# Patient Record
Sex: Male | Born: 1945
Health system: Southern US, Community
[De-identification: ages and names within clinical notes are randomized; demographics above are authoritative.]

## PROBLEM LIST (undated history)

## (undated) DIAGNOSIS — I639 Cerebral infarction, unspecified: Secondary | ICD-10-CM

## (undated) HISTORY — DX: Cerebral infarction, unspecified: I63.9

---

## 1998-05-17 ENCOUNTER — Emergency Department (HOSPITAL_COMMUNITY): Admission: EM | Admit: 1998-05-17 | Discharge: 1998-05-17 | Payer: Self-pay | Admitting: Emergency Medicine

## 2000-01-03 ENCOUNTER — Encounter: Payer: Self-pay | Admitting: Emergency Medicine

## 2000-01-03 ENCOUNTER — Emergency Department (HOSPITAL_COMMUNITY): Admission: EM | Admit: 2000-01-03 | Discharge: 2000-01-03 | Payer: Self-pay | Admitting: Emergency Medicine

## 2000-03-07 ENCOUNTER — Emergency Department (HOSPITAL_COMMUNITY): Admission: EM | Admit: 2000-03-07 | Discharge: 2000-03-07 | Payer: Self-pay | Admitting: Emergency Medicine

## 2000-03-07 ENCOUNTER — Encounter: Payer: Self-pay | Admitting: Emergency Medicine

## 2000-03-15 ENCOUNTER — Encounter: Admission: RE | Admit: 2000-03-15 | Discharge: 2000-03-15 | Payer: Self-pay | Admitting: Hematology and Oncology

## 2000-05-02 ENCOUNTER — Encounter: Admission: RE | Admit: 2000-05-02 | Discharge: 2000-05-02 | Payer: Self-pay | Admitting: Internal Medicine

## 2000-05-07 ENCOUNTER — Encounter: Admission: RE | Admit: 2000-05-07 | Discharge: 2000-05-07 | Payer: Self-pay | Admitting: Hematology and Oncology

## 2000-05-07 ENCOUNTER — Ambulatory Visit (HOSPITAL_COMMUNITY): Admission: RE | Admit: 2000-05-07 | Discharge: 2000-05-07 | Payer: Self-pay | Admitting: *Deleted

## 2000-05-07 ENCOUNTER — Encounter: Payer: Self-pay | Admitting: *Deleted

## 2000-05-14 ENCOUNTER — Encounter: Admission: RE | Admit: 2000-05-14 | Discharge: 2000-05-14 | Payer: Self-pay | Admitting: Hematology and Oncology

## 2000-05-21 ENCOUNTER — Encounter: Admission: RE | Admit: 2000-05-21 | Discharge: 2000-05-21 | Payer: Self-pay | Admitting: Internal Medicine

## 2000-08-28 ENCOUNTER — Encounter: Payer: Self-pay | Admitting: Neurosurgery

## 2000-08-29 ENCOUNTER — Encounter: Payer: Self-pay | Admitting: Neurosurgery

## 2000-08-29 ENCOUNTER — Inpatient Hospital Stay (HOSPITAL_COMMUNITY): Admission: RE | Admit: 2000-08-29 | Discharge: 2000-08-30 | Payer: Self-pay | Admitting: Neurosurgery

## 2000-10-11 ENCOUNTER — Ambulatory Visit (HOSPITAL_COMMUNITY): Admission: RE | Admit: 2000-10-11 | Discharge: 2000-10-11 | Payer: Self-pay | Admitting: Neurosurgery

## 2000-10-11 ENCOUNTER — Encounter: Admission: RE | Admit: 2000-10-11 | Discharge: 2000-10-11 | Payer: Self-pay | Admitting: Internal Medicine

## 2000-10-11 ENCOUNTER — Encounter: Payer: Self-pay | Admitting: Neurosurgery

## 2002-09-14 ENCOUNTER — Encounter: Admission: RE | Admit: 2002-09-14 | Discharge: 2002-09-14 | Payer: Self-pay | Admitting: Internal Medicine

## 2002-10-16 ENCOUNTER — Ambulatory Visit (HOSPITAL_COMMUNITY): Admission: RE | Admit: 2002-10-16 | Discharge: 2002-10-16 | Payer: Self-pay | Admitting: Neurosurgery

## 2002-10-20 ENCOUNTER — Ambulatory Visit (HOSPITAL_COMMUNITY): Admission: RE | Admit: 2002-10-20 | Discharge: 2002-10-20 | Payer: Self-pay | Admitting: Neurosurgery

## 2002-10-20 ENCOUNTER — Encounter: Payer: Self-pay | Admitting: Neurosurgery

## 2002-11-09 ENCOUNTER — Encounter: Admission: RE | Admit: 2002-11-09 | Discharge: 2002-12-03 | Payer: Self-pay | Admitting: Neurosurgery

## 2003-02-19 ENCOUNTER — Emergency Department (HOSPITAL_COMMUNITY): Admission: EM | Admit: 2003-02-19 | Discharge: 2003-02-19 | Payer: Self-pay | Admitting: Emergency Medicine

## 2003-12-25 DIAGNOSIS — I639 Cerebral infarction, unspecified: Secondary | ICD-10-CM

## 2003-12-25 HISTORY — DX: Cerebral infarction, unspecified: I63.9

## 2003-12-25 HISTORY — PX: NECK SURGERY: SHX720

## 2003-12-29 ENCOUNTER — Emergency Department (HOSPITAL_COMMUNITY): Admission: EM | Admit: 2003-12-29 | Discharge: 2003-12-29 | Payer: Self-pay | Admitting: Emergency Medicine

## 2004-01-03 ENCOUNTER — Emergency Department (HOSPITAL_COMMUNITY): Admission: AD | Admit: 2004-01-03 | Discharge: 2004-01-03 | Payer: Self-pay | Admitting: Family Medicine

## 2004-05-19 ENCOUNTER — Encounter: Admission: RE | Admit: 2004-05-19 | Discharge: 2004-05-19 | Payer: Self-pay | Admitting: Internal Medicine

## 2004-05-30 ENCOUNTER — Ambulatory Visit (HOSPITAL_COMMUNITY): Admission: RE | Admit: 2004-05-30 | Discharge: 2004-05-30 | Payer: Self-pay | Admitting: Internal Medicine

## 2004-06-19 ENCOUNTER — Encounter: Admission: RE | Admit: 2004-06-19 | Discharge: 2004-06-19 | Payer: Self-pay | Admitting: Internal Medicine

## 2004-06-27 ENCOUNTER — Encounter: Admission: RE | Admit: 2004-06-27 | Discharge: 2004-06-27 | Payer: Self-pay | Admitting: Internal Medicine

## 2004-08-15 ENCOUNTER — Encounter: Admission: RE | Admit: 2004-08-15 | Discharge: 2004-08-15 | Payer: Self-pay | Admitting: Internal Medicine

## 2004-08-22 ENCOUNTER — Encounter: Admission: RE | Admit: 2004-08-22 | Discharge: 2004-09-01 | Payer: Self-pay | Admitting: Internal Medicine

## 2007-02-22 ENCOUNTER — Emergency Department (HOSPITAL_COMMUNITY): Admission: EM | Admit: 2007-02-22 | Discharge: 2007-02-22 | Payer: Self-pay | Admitting: Emergency Medicine

## 2007-03-06 ENCOUNTER — Ambulatory Visit: Payer: Self-pay | Admitting: Family Medicine

## 2007-03-07 ENCOUNTER — Ambulatory Visit: Payer: Self-pay | Admitting: *Deleted

## 2007-08-06 ENCOUNTER — Ambulatory Visit (HOSPITAL_COMMUNITY): Admission: RE | Admit: 2007-08-06 | Discharge: 2007-08-06 | Payer: Self-pay | Admitting: Urology

## 2007-10-09 ENCOUNTER — Encounter: Payer: Self-pay | Admitting: Urology

## 2007-10-09 ENCOUNTER — Ambulatory Visit (HOSPITAL_COMMUNITY): Admission: RE | Admit: 2007-10-09 | Discharge: 2007-10-11 | Payer: Self-pay | Admitting: Urology

## 2007-11-18 ENCOUNTER — Ambulatory Visit: Payer: Self-pay | Admitting: Family Medicine

## 2007-11-18 LAB — CONVERTED CEMR LAB: PSA: 0.01 ng/mL — ABNORMAL LOW (ref 0.10–4.00)

## 2007-12-25 HISTORY — PX: PROSTATE SURGERY: SHX751

## 2008-02-11 ENCOUNTER — Ambulatory Visit: Payer: Self-pay | Admitting: Family Medicine

## 2008-02-11 LAB — CONVERTED CEMR LAB
ALT: 10 units/L (ref 0–53)
AST: 12 units/L (ref 0–37)
Albumin: 4 g/dL (ref 3.5–5.2)
Basophils Absolute: 0.1 10*3/uL (ref 0.0–0.1)
Basophils Relative: 1 % (ref 0–1)
Calcium: 9.6 mg/dL (ref 8.4–10.5)
Eosinophils Absolute: 0.4 10*3/uL (ref 0.0–0.7)
Eosinophils Relative: 4 % (ref 0–5)
Glucose, Bld: 93 mg/dL (ref 70–99)
HCT: 42.1 % (ref 39.0–52.0)
MCV: 94.8 fL (ref 78.0–100.0)
Monocytes Absolute: 0.9 10*3/uL (ref 0.1–1.0)
Monocytes Relative: 9 % (ref 3–12)
PSA: 0.01 ng/mL — ABNORMAL LOW (ref 0.10–4.00)
Platelets: 276 10*3/uL (ref 150–400)
Potassium: 4.4 meq/L (ref 3.5–5.3)
RDW: 16 % — ABNORMAL HIGH (ref 11.5–15.5)
Sodium: 140 meq/L (ref 135–145)
Total Protein: 7.1 g/dL (ref 6.0–8.3)
WBC: 10.1 10*3/uL (ref 4.0–10.5)

## 2008-03-05 IMAGING — CR DG CHEST 2V
2 series · 2 of 2 positions shown · non-contrast
Comparison: None.

CLINICAL DATA: Pre-op prostate cancer.  
 CHEST ? 2 VIEW:

[w chest pa]
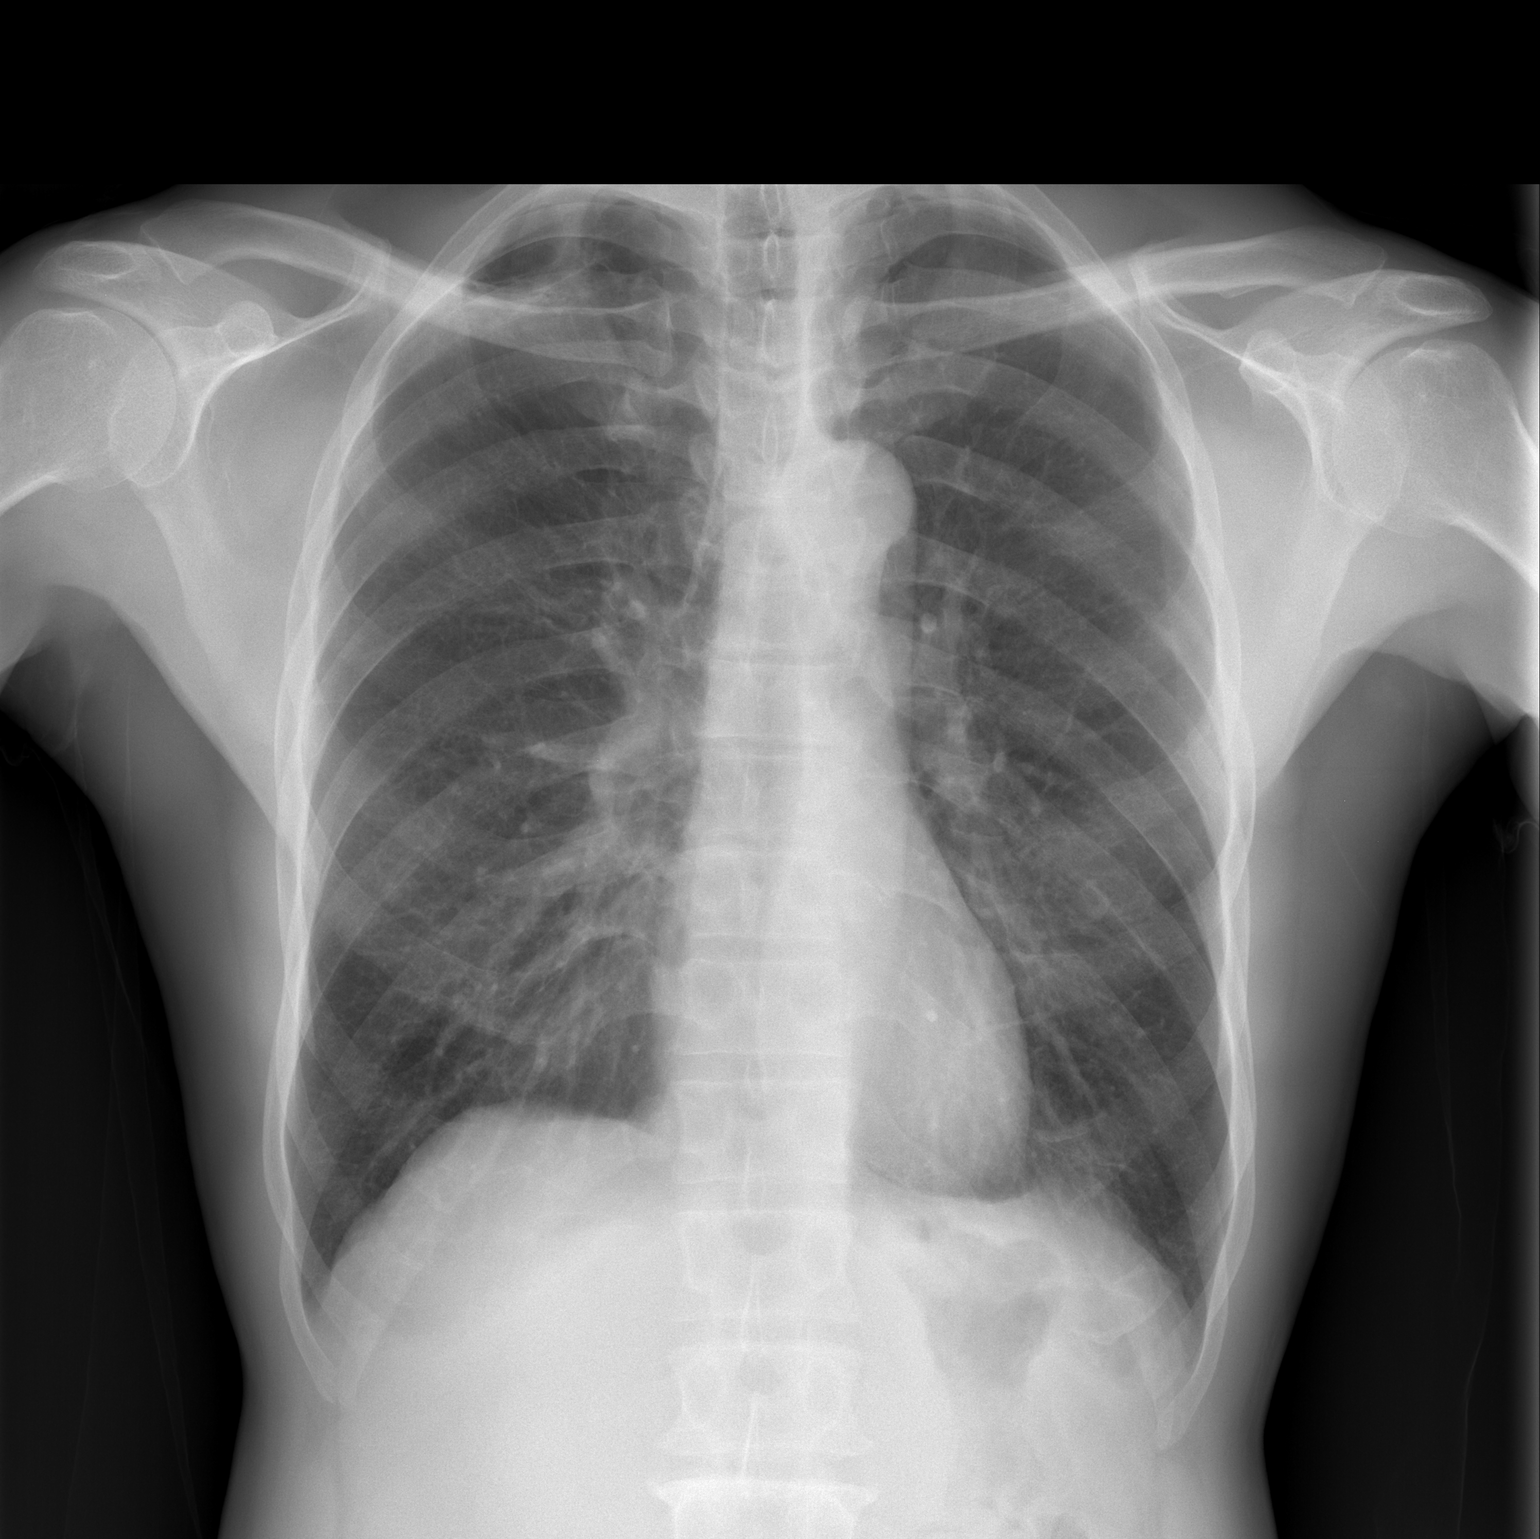

[w chest lat]
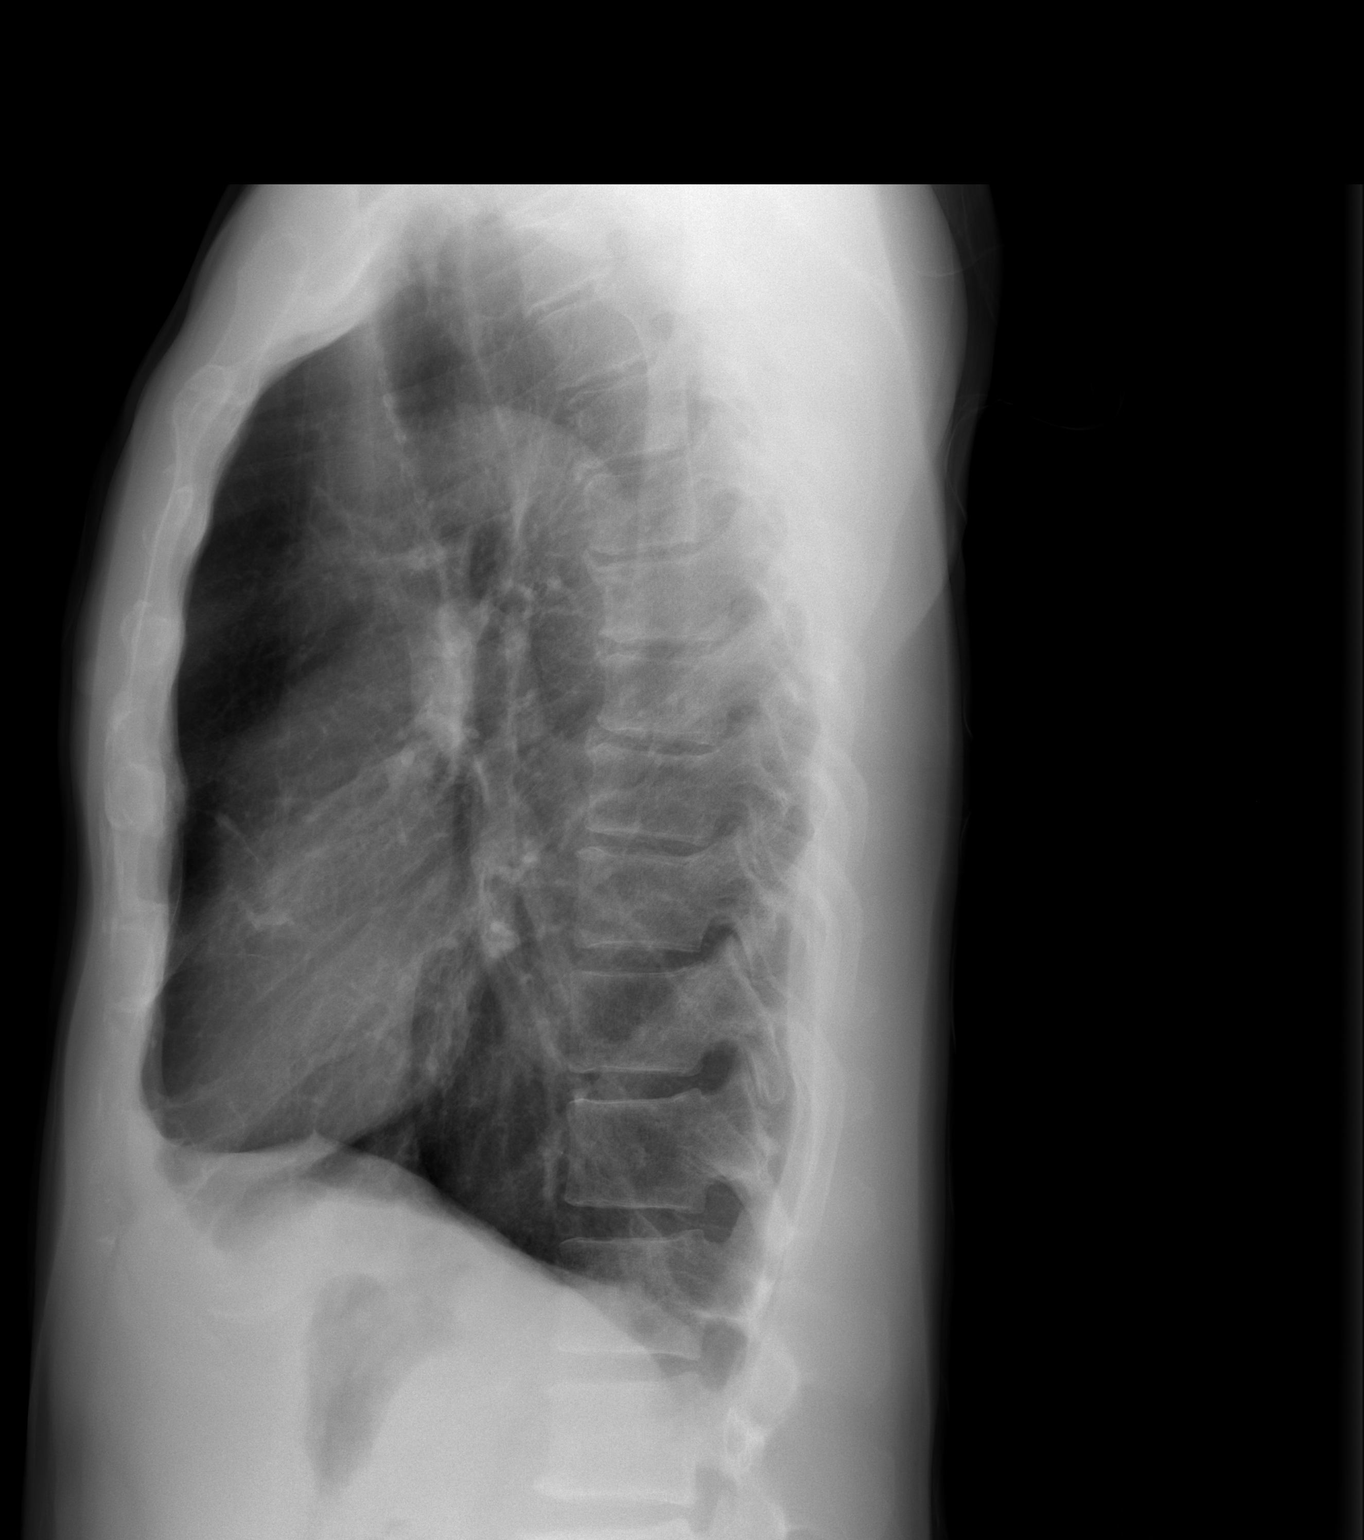

[2 of 2 positions shown; findings below may reference images not displayed]

FINDINGS: Trachea is midline.  Heart size normal.  Lungs are clear.  No pleural fluid.
IMPRESSION: No acute findings.

## 2009-08-05 ENCOUNTER — Ambulatory Visit: Payer: Self-pay | Admitting: Internal Medicine

## 2009-08-05 ENCOUNTER — Encounter: Payer: Self-pay | Admitting: Family Medicine

## 2009-08-05 LAB — CONVERTED CEMR LAB
Basophils Absolute: 0 10*3/uL (ref 0.0–0.1)
Basophils Relative: 0 % (ref 0–1)
CO2: 25 meq/L (ref 19–32)
Creatinine, Ser: 1.14 mg/dL (ref 0.40–1.50)
Eosinophils Absolute: 0.1 10*3/uL (ref 0.0–0.7)
HCT: 45.1 % (ref 39.0–52.0)
Monocytes Absolute: 1.8 10*3/uL — ABNORMAL HIGH (ref 0.1–1.0)
RBC: 4.68 M/uL (ref 4.22–5.81)
Total Bilirubin: 0.8 mg/dL (ref 0.3–1.2)
Total Protein: 8.5 g/dL — ABNORMAL HIGH (ref 6.0–8.3)

## 2009-08-06 ENCOUNTER — Emergency Department (HOSPITAL_COMMUNITY): Admission: EM | Admit: 2009-08-06 | Discharge: 2009-08-06 | Payer: Self-pay | Admitting: Emergency Medicine

## 2011-01-14 ENCOUNTER — Encounter: Payer: Self-pay | Admitting: Internal Medicine

## 2011-05-08 NOTE — H&P (Signed)
NAMEJIHAN, Charles Russell              ACCOUNT NO.:  [id number]   MEDICAL RECORD NO.:  [id number]          PATIENT TYPE:  OIB   LOCATION:  1433                         FACILITY:  [facility]   PHYSICIAN:  Excell Seltzer. Annabell Howells, M.D.    DATE OF BIRTH:  March 15, 1946   DATE OF ADMISSION:  10/09/2007  DATE OF DISCHARGE:                              HISTORY & PHYSICAL   CHIEF COMPLAINT:  Here for surgery.   HISTORY OF PRESENT ILLNESS:  Charles Russell is a 65 year old male who was  initially evaluated for urinary retention.  He is treated for BPH with  Proscar and Flomax.  His retention somewhat improved.  he had a PSA at  the time of his retention episode which is elevated at 16.  Repeat was  8.32.  This prompted transrectal ultrasound-guided biopsy which revealed  Gleason 3 + 3 = 6 in 2 of 6 cores on the right, 5% of cores.  The left  was free of any adenocarcinoma.  He was counseled on active  surveillance, watchful waiting, surgical management, and x-ray therapy.  Because of the patient's urinary history, radiation therapy was not felt  to be an adequate option.  He therefore elected for surgical management.  He presents for an attempt at laparoscopic-assisted radical  prostatectomy.   PAST MEDICAL HISTORY:  1. Heart murmur.  2. Small cerebrovascular accident.   SURGICAL HISTORY:  Cervical laminectomy.   FAMILY HISTORY:  Positive for colon cancer.   SOCIAL HISTORY:  Patient smokes less than 1/2 pack a day of cigarettes.  He has occasional ethanol use.  He is unmarried and has 3 children.   REVIEW OF SYSTEMS:  Positive for constipation.  Multisystem review is  performed and is negative for all symptoms except as in the HPI.   PHYSICAL EXAM:  VITAL SIGNS:  Afebrile with stable vitals.  GENERAL:  No acute distress.  HEENT:  Normocephalic and atraumatic.  NECK:  Supple with no lymphadenopathy.  CHEST:  Clear to auscultation.  HEART:  Regular in rate and rhythm with a soft heart murmur.  ABDOMEN:   Soft, nontender, and nondistended with no surgical scars.  GU:  Uncircumcised phallus.  Testes are descended bilaterally, normal in  contour.  Prostate exam reveals mildly enlarged with 2+ BPH.  There is  no nodularity or induration.  He does have hemorrhoids.  EXTREMITIES:  Warm and well perfused.  SKIN:  Without rashes or lesions.   ASSESSMENT:  Low-risk adenocarcinoma of the prostate.   PLAN:  The patient will be admitted to the hospital.  He will undergo an  attempt at robotic-assisted laparoscopic radical prostatectomy.  He has  been informed of the risks and benefits of the procedure and has signed  the consent form found on the chart.  This will be undertaken under the  care of [name].      Terie Purser, MD      Excell Seltzer. Annabell Howells, M.D.  Electronically Signed    JH/MEDQ  D:  10/09/2007  T:  10/10/2007  Job:  [id number]

## 2011-05-08 NOTE — Op Note (Signed)
NAMESANJIT, Charles Russell              ACCOUNT NO.:  [id number]   MEDICAL RECORD NO.:  [id number]          PATIENT TYPE:  OIB   LOCATION:  1433                         FACILITY:  [facility]   PHYSICIAN:  Excell Seltzer. Annabell Howells, M.D.    DATE OF BIRTH:  10/24/1946   DATE OF PROCEDURE:  10/09/2007  DATE OF DISCHARGE:                               OPERATIVE REPORT   PREOPERATIVE DIAGNOSIS:  Adenocarcinoma of the prostate.   POSTOPERATIVE DIAGNOSIS:  Adenocarcinoma of the prostate.   OPERATION/PROCEDURE:  Robotic-assisted laparoscopic prostatectomy.   SURGEON:  [name], M.D.   ASSISTANT:  Melina Schools.   ANESTHESIA:  General endotracheal.   DRAINS:  Jackson-Pratt drain and  20-French Foley catheter.   ESTIMATED BLOOD LOSS:  250 mL.   INDICATIONS FOR PROCEDURE:  Please see full dictated H&P.  Briefly, this  is a 65 year old male with pretreatment clinical T1C, PSA 8.32, Gleason  3 + 3 = 6  adenocarcinoma the prostate.  He has low-volume disease with  2 of 6 cores on the right with 5% involvement. He presents for surgical  management.  He has been informed of the risks and benefits of the  procedure including erectile dysfunction and urinary incontinence.  He  has no erectile dysfunction at baseline.   DESCRIPTION OF PROCEDURE IN DETAIL:  The patient brought to the  operating, identified by wrist band, informed consent was assured and  preoperative time-out was performed.  After the successful induction of  general endotracheal anesthesia, the patient was moved to low lithotomy  position.  The abdomen was shaved.  Rectal tube was placed.  The abdomen  and genitalia were prepped and draped in usual fashion.  The patient was  placed in steep Trendelenburg and  low lithotomy.  We made a 15 mm  incision 17 cm above the pubic symphysis in the midline just to the left  of the umbilicus.  Through this incision we placed our [facility] port.  We  then began insufflation of carbon dioxide.  Low flow  insufflation  revealed a low pressures.  We then changed to standard flow.  Once  pneumoperitoneum was established, we marked for robotic ports, one hand  width from the camera port.  These were placed under direct vision.  We  also placed additional right-sided 5 mm assistant port and 12 mm system  port.  As these were all placed atraumatically, we via proceeded docking  the robot.  Using the 0-degree lens, we then used a monopolar and  bipolar cautery to divide the medial umbilical ligaments bilaterally.  We developed a space of Retzius and dropped the bladder.  The fat was  cleared off the endopelvic fascia exposing the superior aspect of the  dorsal venous complex.  We incised the endopelvic fascia laterally and I  separated the prostate gland from the levator ani bilaterally.  This was  carried toward the apex of prostate which developed a groove between the  dorsal venous complex and the urethra.  Once we had satisfactory grooves  bilaterally, we used the endovascular 55 mm staple to divide the dorsal  venous complex.  We noted excellent hemostasis.  We then placed gentle  traction on the Foley to reveal the prostatovesical angle.  The bladder  neck and the base of the prostate were separated with monopolar cautery.  The anterior surface was divided until we entered the bladder and noted  the Foley catheter.  This was then advanced and placed on anterior  traction thus elevating the prostate.  We did identify the median lobe  that was protuberant.  We adjusted our bladder neck dissection to ensure  occlusion of the median lobe with the specimen.  The adenoma was  separated off the posterior bladder neck.  We administered indigo  carmine and noted excellent efflux from the ureters.  The posterior  plane was developed until we encountered the structures.  The left vas  deferens was grasped and dissected until it was skeletonized and then  divided.  It was then placed on upper traction.   We then similarly  skeletonized and divided the right vas deferens.  We then proceeded with  blunt dissection and occasional cautery to free up the right and left  seminal vesicles, minimizing cautery laterally near the neurovascular  bundles.  Once the structures were dissected, they were all placed on  anterior traction and posterior plane was developed with blunt  dissection.  With this complete, we proceeded to the nerve spare.  The  structures were released and we use sharp dissection without cautery as  well as blunt dissection to drop the nerves bilaterally.  Once this was  complete, we divided the pedicles bilaterally with a Hem-o-lok clips.  Then proceeded to the apex of prostate.  The urethra was divided  sharply.  The remaining attachments were then divided with the bipolar  cautery.  This completed the radical prostatectomy unit and was placed  out of the operative field.  We then irrigated and insufflated with the  rectal tube and noted no rectal injury.  Hemostasis at this point was  excellent.  We then proceeded to anastomosis.  A single stay suture was  used to reapproximate the posterior urethral and posterior bladder neck.  This was done with a 2-0 Vicryl slip-knot.  A running watertight  vesicourethral anastomosis was then performed with a double-armed  running 4-0 Monocryl.  It was tested and we noted a small leak due to  laxity in the knot.  The knot was then placed on traction and cinched in  place with a single Hem-o-lok clip.  We again retested the anastomosis  and it was noted to be watertight.  At this time we placed a JP drain in  the pelvis and brought it out through the fourth arm port.  We then  bagged the prostate specimen.  The 12 mm system port was closed with a  suture passer and 0 Vicryl  tie.  We then extended the fasciotomy in the  12 mm camera port and extracted the prostate specimen.  This was then  closed with a running 0 Vicryl in the usual  fashion.  All skin closed  with surgical staples.  At this time the procedure was terminated.  The  patient tolerated the procedure well with no complications.  All sponge  and needle counts at the conclusion of the case were correct.   Dr. [name] was the attending primary responsible physician and was  present and participating in all aspects of procedure.   DISPOSITION:  The patient was extubated uneventfully and transferred  safely to post anesthesia care  unit in stable condition.  A      Terie Purser, MD      Excell Seltzer. Annabell Howells, M.D.  Electronically Signed    JH/MEDQ  D:  10/09/2007  T:  10/10/2007  Job:  [id number]

## 2011-05-11 NOTE — Discharge Summary (Signed)
Charles Russell, Charles Russell              ACCOUNT NO.:  [id number]   MEDICAL RECORD NO.:  [id number]          PATIENT TYPE:  INP   LOCATION:  1433                         FACILITY:  [facility]   PHYSICIAN:  Excell Seltzer. Annabell Howells, M.D.    DATE OF BIRTH:  [birth year]   DATE OF ADMISSION:  10/09/2007  DATE OF DISCHARGE:  10/11/2007                               DISCHARGE SUMMARY   ADMISSION DIAGNOSIS:  Adenocarcinoma of the prostate.   DISCHARGE DIAGNOSIS:  Adenocarcinoma of the prostate.   PROCEDURE:  Robotic assisted laparoscopic prostatectomy.   HISTORY OF PRESENT ILLNESS:  Please see full dictated history and  physical.  Briefly, this is a 65 year old male with low-risk  adenocarcinoma of the prostate.  He has a pre-treatment and T1C/PSA  8.32, Gleason 3+3=6, and 2/6 cores on the right with 5% of course  positive.  He presents for robotic assisted laparoscopic prostatectomy.   HOSPITAL COURSE:  The patient is admitted to the hospital on the day of  surgery.  He was taken to the operating room where he underwent the  above-mentioned procedure under general endotracheal anesthesia.  The  patient tolerated the procedure well and had no complications.  He was  admitted to floor postoperatively.  On postoperative day number 1, he  ambulated and was begun on a clear liquid diet.  He was advanced as  tolerated to a  regular diet.  On the morning of postoperative day  number 2, the patient was ambulatory and tolerating a regular diet.  His  pain was controlled with p.o. pain medication.  He had received  instructions on the care of his Foley catheter.  At this time, it was  felt the patient was stable for discharge home.   DISCHARGE MEDICATIONS:  1. Vicodin.  2. Colace.  3. Cipro.  4. Resume home medications.   DISCHARGE INSTRUCTIONS:  1. The patient is told to resume his previous diet.  2. He is up as tolerated.  3. He is instructed to refrain from any strenuous activity and avoid      lifting  anything greater than 10 pounds.  4. He is to follow up as scheduled with Dr. Annabell Howells for catheter      removal.  5. He is to begin his ciprofloxacin 1 day prior to that.  6. The patient's indicated he understood these instructions and was      agreeable with discharge home.      Terie Purser, MD      Excell Seltzer. Annabell Howells, M.D.  Electronically Signed    JH/MEDQ  D:  10/14/2007  T:  10/15/2007  Job:  [id number]

## 2011-05-11 NOTE — Op Note (Signed)
. [facility]  Patient:    Charles Russell, Charles Russell                       MRN: [id number] Proc. Date: 09/05/00 Adm. Date:  [id number] Disc. Date: [id number] Attending:  Donalee Citrin P                           Operative Report  PREOPERATIVE DIAGNOSIS:  Cervical spondylitic myelopathy.  PROCEDURE:  Anterior cervical diskectomy and fusion with tibial allograft and Landers plate at T0-1.  SURGEON:  Donzetta Sprung. Roney Jaffe., M.D.  ASSISTANT:  Reinaldo Meeker, M.D.  ANESTHESIA:  General endotracheal.  DESCRIPTION OF PROCEDURE:  The patient was brought to the operating room and was induced under general anesthesia.  He was positioned supine with the head slightly turned to the patients left.  The right side of his neck was prepped and draped in the usual sterile fashion.  A preoperative x-ray confirmed the C5 vertebral body.  An incision was made transversely along the skin crease. Then Metzenbaum scissors were used to dissect down to the superficial layer of the platysma.  The incision was then divided transversely, deepened.  The platysma was freed up.  Avascular plane down to the prevertebral fascia was isolated.  A spinal needle was inserted in the interspace.  The intraoperative x-ray confirmed the C4-5 interspace.  Then the #11 blade scalpel was used to continue with the annulotomy in the interspace.  The remainder of the disk was removed.   The Midas Rex with the AMA drill bit was used to bring in, and the remainder of the disk space was widened out and the end plates were evened off.  Then attention was paid to the posterior longitudinal ligament.  This was elevated and divided using the 1.0 mm and 2.0 mm Kerrison punch.  After the posterior longitudinal ligament was divided, a large central disk herniation was appreciated, eccentric to the right, as well as a large osteophyte.  These were removed sequentially with the 1.0 and 2.0 mm Kerrison punch until the  thecal sac was visualized and the proximal aspect of the C5 nerve roots bilaterally were visualized.  This was carried out bilaterally. The remainder of the diskectomy, the thecal sac was completely decompressed. The end plates were evened off.  All remaining osteophytes were removed.  The posterior longitudinal ligament was removed.  Both C5 nerve roots were decompressed.  A fibular allograft was selected and inserted under compression, after the Cloward interbody spreader had been inserted.  Then the Landers plate was selected.  This was tacked down with 32.0 mm screws after they were drilled, tapped, and placed.  A postoperative x-ray confirmed good placement of the screws and plate and the bone graft.  The wound was then copiously irrigated.  Meticulous hemostasis was maintained, and #3-0 interrupted Vicryls were used to close the platysma.  The skin was closed with running #4-0 subcuticular.  Benzoin and Steri-Strips were applied.  The wound was dry.  All needle counts were correct.  The patient went to the recovery room in stable condition. DD:  09/05/00 TD:  09/07/00 Job: [id number]

## 2011-05-29 ENCOUNTER — Inpatient Hospital Stay (INDEPENDENT_AMBULATORY_CARE_PROVIDER_SITE_OTHER)
Admission: RE | Admit: 2011-05-29 | Discharge: 2011-05-29 | Disposition: A | Discharge status: Home / Self Care | Payer: Medicare Other | Source: Ambulatory Visit | Attending: Family Medicine | Admitting: Family Medicine

## 2011-05-29 DIAGNOSIS — H60399 Other infective otitis externa, unspecified ear: Secondary | ICD-10-CM

## 2011-05-29 DIAGNOSIS — H60509 Unspecified acute noninfective otitis externa, unspecified ear: Secondary | ICD-10-CM

## 2011-07-12 ENCOUNTER — Inpatient Hospital Stay (INDEPENDENT_AMBULATORY_CARE_PROVIDER_SITE_OTHER)
Admission: RE | Admit: 2011-07-12 | Discharge: 2011-07-12 | Disposition: A | Discharge status: Home / Self Care | Payer: Medicare Other | Source: Ambulatory Visit | Attending: Family Medicine | Admitting: Family Medicine

## 2011-07-12 DIAGNOSIS — S60459A Superficial foreign body of unspecified finger, initial encounter: Secondary | ICD-10-CM

## 2011-10-03 LAB — BASIC METABOLIC PANEL
Calcium: 8.6
GFR calc Af Amer: >60
GFR calc non Af Amer: >60
Glucose, Bld: 122 — ABNORMAL HIGH

## 2011-10-03 LAB — CBC
HCT: 34.6 — ABNORMAL LOW
Hemoglobin: 11.9 — ABNORMAL LOW
MCHC: 34.3
Platelets: 197
RDW: 15 — ABNORMAL HIGH
WBC: 12.1 — ABNORMAL HIGH

## 2011-10-03 LAB — HEMOGLOBIN AND HEMATOCRIT, BLOOD
HCT: 36.9 — ABNORMAL LOW
Hemoglobin: 12.5 — ABNORMAL LOW

## 2011-10-03 LAB — DIFFERENTIAL
Eosinophils Absolute: 0
Neutro Abs: 10.1 — ABNORMAL HIGH

## 2011-10-03 LAB — TYPE AND SCREEN
ABO/RH(D): O POS
Antibody Screen: NEGATIVE

## 2011-10-04 LAB — BASIC METABOLIC PANEL
CO2: 27
Calcium: 9.8
Chloride: 104
GFR calc non Af Amer: >60
Sodium: 139

## 2011-10-04 LAB — HEMOGLOBIN AND HEMATOCRIT, BLOOD: HCT: 42.7

## 2012-04-02 ENCOUNTER — Encounter (HOSPITAL_COMMUNITY): Payer: Self-pay | Admitting: Cardiology

## 2012-04-02 ENCOUNTER — Emergency Department (INDEPENDENT_AMBULATORY_CARE_PROVIDER_SITE_OTHER)
Admission: EM | Admit: 2012-04-02 | Discharge: 2012-04-02 | Disposition: A | Discharge status: Home / Self Care | Payer: Medicare Other | Source: Home / Self Care | Attending: Family Medicine | Admitting: Family Medicine

## 2012-04-02 DIAGNOSIS — H6123 Impacted cerumen, bilateral: Secondary | ICD-10-CM

## 2012-04-02 DIAGNOSIS — H6120 Impacted cerumen, unspecified ear: Secondary | ICD-10-CM

## 2012-04-02 MED ORDER — DOCUSATE SODIUM 50 MG/5ML PO LIQD
ORAL | Status: AC
  Filled 2012-04-02: qty 10

## 2012-04-02 NOTE — ED Notes (Addendum)
Pt reports right ear itching and fullness and has had this in the past. He denies fever or pain. Pt would like an ENT referral. Tolerating PO intake. Onset of symptoms started this past July got better with treatment but pt did not follow-up as directed.

## 2012-04-02 NOTE — ED Provider Notes (Signed)
History     CSN: [id number]  Arrival date & time 04/02/12  1027   First MD Initiated Contact with Patient 04/02/12 1018      Chief Complaint  Patient presents with  . Otalgia    (Consider location/radiation/quality/duration/timing/severity/associated sxs/prior treatment) HPI Comments: Charles Russell presents for evaluation of right-sided ear itching, and fullness. He reports a recurrent history of this. He states that his last episode was in July when he presented here for treatment. It sounds as if he was given ear wicks and ear drops with resolution of his symptoms. He was also given a referral to ENT, which he did not followup on. He now reports recurrence of symptoms in his right ear. He denies any pain. He does report ear fullness, with muffled sounds.  Patient is a 66 y.o. male presenting with plugged ear sensation. The history is provided by the patient.  Ear Fullness This is a new problem. The current episode started more than 2 days ago. The problem occurs constantly. The problem has not changed since onset.Pertinent negatives include no chest pain, no abdominal pain, no headaches and no shortness of breath. The symptoms are aggravated by nothing. The symptoms are relieved by nothing. He has tried nothing for the symptoms.    History reviewed. No pertinent past medical history.  Past Surgical History  Procedure Date  . Neck surgery 2005  . Prostate surgery 2009    Family History  Problem Relation Age of Onset  . Heart disease Mother   . Tuberculosis Mother   . Heart disease Father   . Cancer Father     History  Substance Use Topics  . Smoking status: Current Everyday Smoker -- 0.5 packs/day    Types: Cigarettes  . Smokeless tobacco: Not on file  . Alcohol Use: Yes     occasional      Review of Systems  Constitutional: Negative.   HENT: Positive for ear discharge. Negative for ear pain.   Eyes: Negative.   Respiratory: Negative.  Negative for shortness of breath.    Cardiovascular: Negative.  Negative for chest pain.  Gastrointestinal: Negative.  Negative for abdominal pain.  Genitourinary: Negative.   Musculoskeletal: Negative.   Skin: Negative.   Neurological: Negative.  Negative for headaches.    Allergies  Review of patient's allergies indicates no known allergies.  Home Medications  No current outpatient prescriptions on file.  BP 131/73  Pulse 73  Temp(Src) 98.5 F (36.9 C) (Oral)  Resp 18  SpO2 98%  Physical Exam  Nursing note and vitals reviewed. Constitutional: He is oriented to person, place, and time. He appears well-developed and well-nourished.  HENT:  Head: Normocephalic and atraumatic.  Ears:  Eyes: EOM are normal.  Neck: Normal range of motion.  Pulmonary/Chest: Effort normal.  Musculoskeletal: Normal range of motion.  Neurological: He is alert and oriented to person, place, and time.  Skin: Skin is warm and dry.  Psychiatric: His behavior is normal.    ED Course  Procedures (including critical care time)  Labs Reviewed - No data to display No results found.   1. Impacted cerumen of both ears       MDM  Improved symptoms after bilateral ear irrigation        Renaee Munda, MD 04/02/12 1312

## 2012-04-02 NOTE — Discharge Instructions (Signed)
You may use an over the counter product such as mineral oil, or commercial products such as Debrox, a peroxide based ear wax removal product, for monthly maintenance of ear wax buildup. Use as directed. Follow up with your regular provider for discussion regarding a possible referral to ENT. Return to care should your symptoms not improve, or worsen in any way.

## 2012-10-28 ENCOUNTER — Encounter (HOSPITAL_COMMUNITY): Payer: Self-pay | Admitting: Emergency Medicine

## 2012-10-28 ENCOUNTER — Emergency Department (INDEPENDENT_AMBULATORY_CARE_PROVIDER_SITE_OTHER)
Admission: EM | Admit: 2012-10-28 | Discharge: 2012-10-28 | Disposition: A | Discharge status: Home / Self Care | Payer: Medicaid Other | Source: Home / Self Care | Attending: Family Medicine | Admitting: Family Medicine

## 2012-10-28 DIAGNOSIS — H9209 Otalgia, unspecified ear: Secondary | ICD-10-CM

## 2012-10-28 DIAGNOSIS — H6093 Unspecified otitis externa, bilateral: Secondary | ICD-10-CM

## 2012-10-28 DIAGNOSIS — H60399 Other infective otitis externa, unspecified ear: Secondary | ICD-10-CM

## 2012-10-28 MED ORDER — NEOMYCIN-POLYMYXIN-HC 3.5-10000-1 OT SUSP: 4.0000 [drp] | Freq: Four times a day (QID) | OTIC | Status: DC

## 2012-10-28 NOTE — ED Provider Notes (Signed)
History     CSN: [id number]  Arrival date & time 10/28/12  1155   First MD Initiated Contact with Patient 10/28/12 1442      Chief Complaint  Patient presents with  . Otalgia    (Consider location/radiation/quality/duration/timing/severity/associated sxs/prior treatment) Patient is a 66 y.o. male presenting with ear pain. The history is provided by the patient.  Otalgia This is a recurrent problem. The current episode started more than 1 week ago (2 weeks ago). There is pain in both ears. The problem occurs daily. The problem has been gradually worsening. There has been no fever. The pain is mild. Associated symptoms include ear discharge and hearing loss. Pertinent negatives include no headaches, no rhinorrhea, no sore throat, no abdominal pain, no diarrhea, no vomiting, no neck pain, no cough and no rash. His past medical history is significant for chronic ear infection and hearing loss. His past medical history does not include tympanostomy tube.    History reviewed. No pertinent past medical history.  Past Surgical History  Procedure Date  . Neck surgery 2005  . Prostate surgery 2009    Family History  Problem Relation Age of Onset  . Heart disease Mother   . Tuberculosis Mother   . Heart disease Father   . Cancer Father     History  Substance Use Topics  . Smoking status: Current Every Day Smoker -- 0.5 packs/day    Types: Cigarettes  . Smokeless tobacco: Not on file  . Alcohol Use: Yes     Comment: occasional      Review of Systems  HENT: Positive for hearing loss, ear pain and ear discharge. Negative for sore throat, rhinorrhea and neck pain.   Respiratory: Negative for cough.   Gastrointestinal: Negative for vomiting, abdominal pain and diarrhea.  Skin: Negative for rash.  Neurological: Negative for headaches.  All other systems reviewed and are negative.    Allergies  Review of patient's allergies indicates no known allergies.  Home Medications    No current outpatient prescriptions on file.  BP 98/65  Pulse 64  Temp 98.6 F (37 C) (Oral)  Resp 16  SpO2 99%  Physical Exam  Nursing note and vitals reviewed. Constitutional: He is oriented to person, place, and time. Vital signs are normal. He appears well-developed and well-nourished. He is active and cooperative.  HENT:  Head: Normocephalic.  Right Ear: There is drainage, swelling and tenderness. Decreased hearing is noted.  Left Ear: There is drainage, swelling and tenderness. Decreased hearing is noted.  Nose: Nose normal. Right sinus exhibits no maxillary sinus tenderness and no frontal sinus tenderness. Left sinus exhibits no maxillary sinus tenderness and no frontal sinus tenderness.  Mouth/Throat: Uvula is midline and mucous membranes are normal. Posterior oropharyngeal erythema present. No posterior oropharyngeal edema.       Unable to visualize TM's  Eyes: Conjunctivae normal are normal. Pupils are equal, round, and reactive to light. No scleral icterus.  Neck: Trachea normal. Neck supple.  Cardiovascular: Normal rate and regular rhythm.   Pulmonary/Chest: Effort normal and breath sounds normal.  Lymphadenopathy:       Head (right side): No submental, no submandibular, no tonsillar, no preauricular, no posterior auricular and no occipital adenopathy present.       Head (left side): No submental, no submandibular, no tonsillar, no preauricular, no posterior auricular and no occipital adenopathy present.    He has no cervical adenopathy.  Neurological: He is alert and oriented to person, place, and time. No  cranial nerve deficit or sensory deficit.  Skin: Skin is warm and dry.  Psychiatric: He has a normal mood and affect. His speech is normal and behavior is normal. Judgment and thought content normal. Cognition and memory are normal.    ED Course  Procedures (including critical care time)  Labs Reviewed - No data to display No results found.   1. Bilateral  otitis externa   2. Otalgia       MDM  Antibiotic ear drops, follow up with ENT this week.        Johnsie Kindred, NP 10/28/12 1449

## 2012-10-28 NOTE — ED Notes (Signed)
Reports ear pain for 2 weeks.  Reports both ears hurt, but right hurts the most.  Denies cold or cough.

## 2012-10-28 NOTE — ED Notes (Signed)
Patient was called at 1309 no answer

## 2012-10-31 NOTE — ED Provider Notes (Signed)
Medical screening examination/treatment/procedure(s) were performed by resident physician or non-physician practitioner and as supervising physician I was immediately available for consultation/collaboration.   Delesa Kawa DOUGLAS MD.    Sayvion Vigen D Caison Hearn, MD 10/31/12 0827 

## 2013-01-06 ENCOUNTER — Emergency Department (INDEPENDENT_AMBULATORY_CARE_PROVIDER_SITE_OTHER)
Admission: EM | Admit: 2013-01-06 | Discharge: 2013-01-06 | Disposition: A | Discharge status: Home / Self Care | Payer: Medicare Other | Source: Home / Self Care

## 2013-01-06 ENCOUNTER — Encounter (HOSPITAL_COMMUNITY): Payer: Self-pay | Admitting: *Deleted

## 2013-01-06 DIAGNOSIS — T169XXA Foreign body in ear, unspecified ear, initial encounter: Secondary | ICD-10-CM

## 2013-01-06 NOTE — ED Provider Notes (Signed)
History     CSN: [id number]  Arrival date & time 01/06/13  1247   None     Chief Complaint  Patient presents with  . Foreign Body in Ear    (Consider location/radiation/quality/duration/timing/severity/associated sxs/prior treatment) HPI Comments: 67 year old man was cleaning his right ear out with a Q-tip 2 days ago. When he removed the Q-tip the cotton portion and remained in his ear canal. He is complaining of minor discomfort but no pain. He is here to have it removed.   History reviewed. No pertinent past medical history.  Past Surgical History  Procedure Date  . Neck surgery 2005  . Prostate surgery 2009    Family History  Problem Relation Age of Onset  . Heart disease Mother   . Tuberculosis Mother   . Heart disease Father   . Cancer Father     History  Substance Use Topics  . Smoking status: Current Every Day Smoker -- 0.5 packs/day    Types: Cigarettes  . Smokeless tobacco: Not on file  . Alcohol Use: Yes     Comment: occasional      Review of Systems  All other systems reviewed and are negative.    Allergies  Review of patient's allergies indicates no known allergies.  Home Medications   Current Outpatient Rx  Name  Route  Sig  Dispense  Refill  . NEOMYCIN-POLYMYXIN-HC 3.5-10000-1 OT SUSP   Both Ears   Place 4 drops into both ears 4 (four) times daily. For seven days.   10 mL   0     BP 136/78  Pulse 88  Temp 98.1 F (36.7 C) (Oral)  Resp 18  SpO2 98%  Physical Exam  Constitutional: He is oriented to person, place, and time. He appears well-developed and well-nourished. No distress.  HENT:  Mouth/Throat: Oropharynx is clear and moist.       The right EAC is narrow and there is a cerumen. I am unable to actually visualize the fragment of cotton. There is no drainage or local pain.  Neck: Normal range of motion. Neck supple.  Pulmonary/Chest: Effort normal.  Lymphadenopathy:    He has no cervical adenopathy.  Neurological: He is  alert and oriented to person, place, and time.  Skin: Skin is warm and dry.  Psychiatric: He has a normal mood and affect.    ED Course  Procedures (including critical care time)  Labs Reviewed - No data to display No results found.   1. Foreign body in ear       MDM  The right ear canal is irrigated with warm water and a foreign body was dislodged and removed. After irrigation the canal is clear of foreign body with residual cerumen. TM is pearly gray without signs of infection. Patient states he feels much better and can hear much better and denies pain or discomfort in the right ear.        Hayden Rasmussen, NP 01/06/13 1517

## 2013-01-06 NOTE — ED Notes (Signed)
Pt reports cleaning out ear on Sunday and the cotton portion of the qtip remained in his right ear - denies pain but states that hearing is muffled.

## 2013-01-12 NOTE — ED Provider Notes (Signed)
Medical screening examination/treatment/procedure(s) were performed by resident physician or non-physician practitioner and as supervising physician I was immediately available for consultation/collaboration.   Barkley Bruns MD.    Linna Hoff, MD 01/12/13 [phone]

## 2014-05-06 ENCOUNTER — Ambulatory Visit (INDEPENDENT_AMBULATORY_CARE_PROVIDER_SITE_OTHER): Payer: PRIVATE HEALTH INSURANCE | Admitting: Family Medicine

## 2014-05-06 ENCOUNTER — Encounter: Payer: Self-pay | Admitting: Family Medicine

## 2014-05-06 ENCOUNTER — Telehealth: Payer: Self-pay

## 2014-05-06 VITALS — BP 110/68 | HR 68 | Temp 98.0°F | Resp 16 | Ht 65.0 in | Wt 141.4 lb

## 2014-05-06 DIAGNOSIS — H608X9 Other otitis externa, unspecified ear: Secondary | ICD-10-CM

## 2014-05-06 DIAGNOSIS — Z1159 Encounter for screening for other viral diseases: Secondary | ICD-10-CM

## 2014-05-06 DIAGNOSIS — Z800 Family history of malignant neoplasm of digestive organs: Secondary | ICD-10-CM

## 2014-05-06 DIAGNOSIS — Z1212 Encounter for screening for malignant neoplasm of rectum: Secondary | ICD-10-CM

## 2014-05-06 DIAGNOSIS — H6060 Unspecified chronic otitis externa, unspecified ear: Secondary | ICD-10-CM

## 2014-05-06 DIAGNOSIS — H6063 Unspecified chronic otitis externa, bilateral: Secondary | ICD-10-CM

## 2014-05-06 DIAGNOSIS — Z23 Encounter for immunization: Secondary | ICD-10-CM

## 2014-05-06 DIAGNOSIS — Z1211 Encounter for screening for malignant neoplasm of colon: Secondary | ICD-10-CM

## 2014-05-06 DIAGNOSIS — H9190 Unspecified hearing loss, unspecified ear: Secondary | ICD-10-CM

## 2014-05-06 LAB — CBC WITH DIFFERENTIAL/PLATELET
BASOS PCT: 0 % (ref 0–1)
Basophils Absolute: 0 10*3/uL (ref 0.0–0.1)
EOS PCT: 2 % (ref 0–5)
Eosinophils Absolute: 0.2 10*3/uL (ref 0.0–0.7)
HCT: 44 % (ref 39.0–52.0)
HEMOGLOBIN: 14.9 g/dL (ref 13.0–17.0)
LYMPHS ABS: 1.9 10*3/uL (ref 0.7–4.0)
Lymphocytes Relative: 21 % (ref 12–46)
MCH: 31.4 pg (ref 26.0–34.0)
MCHC: 33.9 g/dL (ref 30.0–36.0)
MCV: 92.8 fL (ref 78.0–100.0)
MONO ABS: 1 10*3/uL (ref 0.1–1.0)
MONOS PCT: 11 % (ref 3–12)
NEUTROS ABS: 5.9 10*3/uL (ref 1.7–7.7)
NEUTROS PCT: 66 % (ref 43–77)
Platelets: 222 10*3/uL (ref 150–400)
RBC: 4.74 MIL/uL (ref 4.22–5.81)
RDW: 15.3 % (ref 11.5–15.5)
WBC: 9 10*3/uL (ref 4.0–10.5)

## 2014-05-06 LAB — COMPLETE METABOLIC PANEL WITH GFR
ALK PHOS: 120 U/L — AB (ref 39–117)
ALT: 9 U/L (ref 0–53)
AST: 18 U/L (ref 0–37)
Albumin: 4.4 g/dL (ref 3.5–5.2)
BUN: 12 mg/dL (ref 6–23)
CO2: 26 meq/L (ref 19–32)
Calcium: 9.8 mg/dL (ref 8.4–10.5)
Chloride: 103 mEq/L (ref 96–112)
Creat: 1.2 mg/dL (ref 0.50–1.35)
GFR, EST AFRICAN AMERICAN: 71 mL/min
GFR, EST NON AFRICAN AMERICAN: 62 mL/min
Glucose, Bld: 95 mg/dL (ref 70–99)
Potassium: 3.9 mEq/L (ref 3.5–5.3)
Sodium: 139 mEq/L (ref 135–145)
Total Bilirubin: 0.6 mg/dL (ref 0.2–1.2)
Total Protein: 7.5 g/dL (ref 6.0–8.3)

## 2014-05-06 LAB — HEPATITIS C ANTIBODY: HCV AB: NEGATIVE

## 2014-05-06 MED ORDER — ACETIC ACID 2 % OT SOLN: 4.0000 [drp] | Freq: Three times a day (TID) | OTIC | Status: DC

## 2014-05-06 NOTE — Telephone Encounter (Signed)
I spoke w/ pt and advised that he can make a home-made version of acetic acid ear drops. Mix equal parts white vinegar with hydrogen peroxide; place 4 3-drops in each ear three times a day for 5 days. If irritation or discomfort occurs, he should discontinue use of drops. He understands.

## 2014-05-06 NOTE — Telephone Encounter (Signed)
Pt called because the ear drops that he was prescribed are not covered by medicare, and he can not afford would like to know if there in an alternative. Please call [phone]

## 2014-05-06 NOTE — Patient Instructions (Signed)
You will be contacted by our staff with information about appointments with the GI specialist to discuss colonoscopy and with the Ear, Nose and Throat specialist to discuss your hearing and ear infections.  You will be scheduled to see me again in about 6 months for a complete physical exam.  You will be notified about your lab results.

## 2014-05-06 NOTE — Telephone Encounter (Signed)
Are there any less expensive alternatives?

## 2014-05-08 DIAGNOSIS — K089 Disorder of teeth and supporting structures, unspecified: Secondary | ICD-10-CM | POA: Insufficient documentation

## 2014-05-08 DIAGNOSIS — Z800 Family history of malignant neoplasm of digestive organs: Secondary | ICD-10-CM | POA: Insufficient documentation

## 2014-05-08 DIAGNOSIS — H9190 Unspecified hearing loss, unspecified ear: Secondary | ICD-10-CM | POA: Insufficient documentation

## 2014-05-08 DIAGNOSIS — H6063 Unspecified chronic otitis externa, bilateral: Secondary | ICD-10-CM | POA: Insufficient documentation

## 2014-05-08 NOTE — Progress Notes (Signed)
Subjective:    Patient ID: Charles Russell, male    DOB: 10/02/1946, 68 y.o.   MRN: [id number]  HPI  This 68 y.o. AA male is here to establish care; he [facility]. HE denies any chronic medical problems requiring medication. He has recurrent ear canal infection with drainage and itching. The problem is temporarily relieved w/ ear drops. His has some hearing impairment that has not been evaluated. Otherwise, he feels good.  HCM: CRS- Has not had this screening.           IMM- ? Tetanus status. Needs Pneumonia vaccine and Shingles vaccine.           Prostate screening- Not recently.  PMHx, Surg Hx, Soc and Fam Hx reviewed. No chronic medications.  Review of Systems  Constitutional: Negative.   HENT: Positive for dental problem, ear discharge and hearing loss. Negative for mouth sores, sore throat, tinnitus, trouble swallowing and voice change.   Eyes: Negative.   Respiratory: Negative.   Cardiovascular: Negative.   Gastrointestinal: Negative.   Endocrine: Negative.   Genitourinary: Negative.   Musculoskeletal: Negative.   Skin: Negative.   Allergic/Immunologic: Negative.   Neurological: Negative.   Hematological: Negative.   Psychiatric/Behavioral: Negative.        Objective:   Physical Exam  Nursing note and vitals reviewed. Constitutional: He is oriented to person, place, and time. Vital signs are normal. He appears well-developed and well-nourished. No distress.  HENT:  Head: Normocephalic and atraumatic.  Right Ear: Hearing and external ear normal.  Left Ear: Hearing and external ear normal.  Nose: Nose normal. No nasal deformity or septal deviation.  Mouth/Throat: Uvula is midline, oropharynx is clear and moist and mucous membranes are normal. No oral lesions. Abnormal dentition. Dental caries present.  EACs- scaly debris obscuring TMs.  Eyes: Conjunctivae, EOM and lids are normal. Pupils are equal, round, and  reactive to light. No scleral icterus.  Neck: Trachea normal, normal range of motion and full passive range of motion without pain. Neck supple. No JVD present. No spinous process tenderness and no muscular tenderness present. No mass and no thyromegaly present.  Cardiovascular: Normal rate, regular rhythm, S1 normal, S2 normal and normal heart sounds.   No extrasystoles are present. Exam reveals no gallop and no friction rub.   No murmur heard. Pulmonary/Chest: Effort normal and breath sounds normal. No respiratory distress.  Abdominal: Soft. Normal appearance. He exhibits no distension and no mass. There is no hepatosplenomegaly. There is no tenderness. There is no CVA tenderness.  Musculoskeletal: Normal range of motion. He exhibits no edema and no tenderness.  Neurological: He is alert and oriented to person, place, and time. No cranial nerve deficit. He exhibits normal muscle tone. Coordination normal.  Skin: Skin is warm and dry. No rash noted. He is not diaphoretic. No erythema.  Psychiatric: He has a normal mood and affect. His behavior is normal. Judgment and thought content normal.      Assessment & Plan:  Chronic otitis externa of both ears - Plan: Ambulatory referral to ENT, COMPLETE METABOLIC PANEL WITH GFR, CBC with Differential  Impaired hearing - Plan: Ambulatory referral to ENT, COMPLETE METABOLIC PANEL WITH GFR  Family history of malignant neoplasm of gastrointestinal tract - Plan: Ambulatory referral to Gastroenterology, COMPLETE METABOLIC PANEL WITH GFR  Screening for colorectal cancer - Plan: Ambulatory referral to Gastroenterology  Need for prophylactic vaccination against Streptococcus pneumoniae (pneumococcus) - Plan: Pneumococcal polysaccharide vaccine 23-valent  greater than or equal to 2yo subcutaneous/IM  Need for hepatitis C screening test - Plan: Hepatitis C antibody

## 2014-05-08 NOTE — Progress Notes (Signed)
Quick Note:  Please notify pt that results are normal.   Provide pt with copy of labs. ______ 

## 2014-05-13 ENCOUNTER — Telehealth: Payer: Self-pay

## 2014-05-13 NOTE — Telephone Encounter (Signed)
Patient called and said he was told that his insurance doesn't cover ear drop medication. He called [employer] and was told that they do cover it. [facility] [phone]

## 2014-05-13 NOTE — Telephone Encounter (Signed)
Pt was prescribed eardrops by Dr. Orson ApeMcphearson , but insurance does not cover,  Might need something else to be prescribes.

## 2014-06-10 ENCOUNTER — Ambulatory Visit (AMBULATORY_SURGERY_CENTER): Payer: Self-pay

## 2014-06-10 VITALS — Ht 65.5 in | Wt 141.2 lb

## 2014-06-10 DIAGNOSIS — Z800 Family history of malignant neoplasm of digestive organs: Secondary | ICD-10-CM

## 2014-06-10 MED ORDER — SUPREP BOWEL PREP KIT 17.5-3.13-1.6 GM/177ML PO SOLN: 1.0000 | Freq: Once | ORAL | Status: AC

## 2014-06-10 NOTE — Progress Notes (Signed)
No allergies to eggs or soy No diet/weight loss meds No  Home oxygen No past problems with anesthesia Has email  Emmi instructions given for colonoscopy

## 2014-06-30 ENCOUNTER — Encounter: Payer: Medicare Other | Admitting: Internal Medicine

## 2014-07-13 ENCOUNTER — Telehealth: Payer: Self-pay

## 2014-07-13 NOTE — Telephone Encounter (Signed)
PT CALLED IN REGARDING HIS HANDICAP PAPERWORK DROPPED OFF FOR DR. Audria NineMcPHERSON , PLEASE CALL AND ADVISE PT

## 2014-07-19 NOTE — Telephone Encounter (Signed)
Unable to reach patient at numbers provided in chart/ disconnected. Unclear what he needs.

## 2014-11-12 ENCOUNTER — Encounter: Payer: PRIVATE HEALTH INSURANCE | Admitting: Family Medicine

## 2015-01-04 ENCOUNTER — Telehealth: Payer: Self-pay

## 2015-01-04 DIAGNOSIS — H9190 Unspecified hearing loss, unspecified ear: Secondary | ICD-10-CM

## 2015-01-04 DIAGNOSIS — H6063 Unspecified chronic otitis externa, bilateral: Secondary | ICD-10-CM

## 2015-01-04 NOTE — Telephone Encounter (Signed)
Patient called requesting a referral from Dr. Audria NineMcpherson again for ENT dr. He says he had one along time ago but declined it. He feels ready now to go.  Pt prefers early morning appts. He has impaired hearing and chronic otitis external.  please advise if he needs to RTC or if referral can be placed for him. Thank you   Best: [phone]

## 2015-01-04 NOTE — Telephone Encounter (Signed)
I ordered ENT referral for pt. Remind him that he is over[facility] for [facility] Subsequent annual CPE. Thank you.

## 2015-01-04 NOTE — Telephone Encounter (Signed)
Pt is aware of message

## 2015-01-21 ENCOUNTER — Encounter (HOSPITAL_COMMUNITY): Payer: Self-pay | Admitting: Emergency Medicine

## 2015-01-21 ENCOUNTER — Emergency Department (INDEPENDENT_AMBULATORY_CARE_PROVIDER_SITE_OTHER)
Admission: EM | Admit: 2015-01-21 | Discharge: 2015-01-21 | Disposition: A | Discharge status: Home / Self Care | Payer: Medicare Other | Source: Home / Self Care | Attending: Family Medicine | Admitting: Family Medicine

## 2015-01-21 DIAGNOSIS — H6063 Unspecified chronic otitis externa, bilateral: Secondary | ICD-10-CM | POA: Diagnosis not present

## 2015-01-21 DIAGNOSIS — H6123 Impacted cerumen, bilateral: Secondary | ICD-10-CM | POA: Diagnosis not present

## 2015-01-21 MED ORDER — HYDROCORTISONE-ACETIC ACID 1-2 % OT SOLN: 5.0000 [drp] | Freq: Three times a day (TID) | OTIC | Status: AC

## 2015-01-21 NOTE — ED Notes (Signed)
Pt states that he has had ear pain for over a month with no relief. Pt is having a difficult time hearing. Pt is in no acute distress at this time.

## 2015-01-21 NOTE — ED Provider Notes (Signed)
CSN: [id number]     Arrival date & time 01/21/15  1030 History   First MD Initiated Contact with Patient 01/21/15 1115     Chief Complaint  Patient presents with  . Otalgia   (Consider location/radiation/quality/duration/timing/severity/associated sxs/prior Treatment) HPI Comments: Patient states both of his ears are clogged and he cannot hear. States he had been using a daily ear drop medication prescribed by his PCP, but he has since run out of this medication.  Old records indicate a history chronic otitis externa treated with Vosol. PCP: Dr. Doneen Poisson  Patient is a 69 y.o. male presenting with ear pain. The history is provided by the patient.  Otalgia   Past Medical History  Diagnosis Date  . Stroke 2005    or 2006   Past Surgical History  Procedure Laterality Date  . Neck surgery  2005  . Prostate surgery  2009    Dr. Jeffie Pollock- Robotic surgery   Family History  Problem Relation Age of Onset  . Heart disease Mother   . Tuberculosis Mother   . Heart disease Father   . Cancer Father     colon  . Colon cancer Father   . Alcohol abuse Brother     cirrohsis of liver   History  Substance Use Topics  . Smoking status: Current Every Day Smoker -- 0.50 packs/day    Types: Cigarettes  . Smokeless tobacco: Never Used  . Alcohol Use: No     Comment: occasional    Review of Systems  HENT: Positive for ear pain.   All other systems reviewed and are negative.   Allergies  Review of patient's allergies indicates no known allergies.  Home Medications   Prior to Admission medications   Medication Sig Start Date End Date Taking? Authorizing Provider  acetic acid-hydrocortisone (VOSOL-HC) otic solution Place 5 drops into both ears 3 (three) times daily. 01/21/15   Lutricia Feil, PA  SUPREP BOWEL PREP SOLN Take 1 kit by mouth once. 06/10/14   Gatha Mayer, MD   BP 110/73 mmHg  Pulse 72  Temp(Src) 98.3 F (36.8 C) (Oral)  Resp 14  SpO2 98% Physical Exam   Constitutional: He is oriented to person, place, and time. He appears well-developed and well-nourished. No distress.  HENT:  Head: Normocephalic and atraumatic.  Right Ear: External ear normal. No tenderness. Decreased hearing is noted.  Left Ear: External ear normal. No tenderness. Decreased hearing is noted.  Nose: Nose normal.  Mouth/Throat: Uvula is midline, oropharynx is clear and moist and mucous membranes are normal.  Exudate and cerumen in both ear canal prevents visualization of TMs  Generally poor dentition.   Eyes: Conjunctivae are normal. No scleral icterus.  Cardiovascular: Normal rate, regular rhythm and normal heart sounds.   Pulmonary/Chest: Effort normal and breath sounds normal.  Musculoskeletal: Normal range of motion.  Neurological: He is alert and oriented to person, place, and time.  Skin: Skin is warm and dry.  Psychiatric: He has a normal mood and affect. His behavior is normal.  Nursing note and vitals reviewed.   ED Course  Procedures (including critical care time) Labs Review Labs Reviewed - No data to display  Imaging Review No results found.   MDM   1. Cerumen impaction, bilateral   2. Chronic otitis externa of both ears   ears cleared of cerumen and exudate with irrigation and patient given new Rx for Vosol to be used as prescribed. Referred to ENT.  Lutricia Feil, [place] 01/21/15 1314

## 2015-01-21 NOTE — Discharge Instructions (Signed)

## 2016-12-26 DIAGNOSIS — H900 Conductive hearing loss, bilateral: Secondary | ICD-10-CM | POA: Diagnosis not present

## 2016-12-26 DIAGNOSIS — H6123 Impacted cerumen, bilateral: Secondary | ICD-10-CM | POA: Diagnosis not present

## 2016-12-26 DIAGNOSIS — H608X3 Other otitis externa, bilateral: Secondary | ICD-10-CM | POA: Diagnosis not present

## 2017-01-04 DIAGNOSIS — Z8673 Personal history of transient ischemic attack (TIA), and cerebral infarction without residual deficits: Secondary | ICD-10-CM | POA: Diagnosis not present

## 2017-01-04 DIAGNOSIS — R7303 Prediabetes: Secondary | ICD-10-CM | POA: Diagnosis not present

## 2017-01-04 DIAGNOSIS — E785 Hyperlipidemia, unspecified: Secondary | ICD-10-CM | POA: Diagnosis not present

## 2017-01-14 DIAGNOSIS — R3121 Asymptomatic microscopic hematuria: Secondary | ICD-10-CM | POA: Diagnosis not present

## 2017-01-15 DIAGNOSIS — H608X3 Other otitis externa, bilateral: Secondary | ICD-10-CM | POA: Diagnosis not present

## 2017-01-15 DIAGNOSIS — H9072 Mixed conductive and sensorineural hearing loss, unilateral, left ear, with unrestricted hearing on the contralateral side: Secondary | ICD-10-CM | POA: Diagnosis not present

## 2017-01-15 DIAGNOSIS — H6122 Impacted cerumen, left ear: Secondary | ICD-10-CM | POA: Diagnosis not present

## 2017-02-05 DIAGNOSIS — H6123 Impacted cerumen, bilateral: Secondary | ICD-10-CM | POA: Diagnosis not present

## 2017-02-05 DIAGNOSIS — H608X2 Other otitis externa, left ear: Secondary | ICD-10-CM | POA: Diagnosis not present

## 2017-02-15 DIAGNOSIS — E559 Vitamin D deficiency, unspecified: Secondary | ICD-10-CM | POA: Diagnosis not present

## 2017-02-15 DIAGNOSIS — E538 Deficiency of other specified B group vitamins: Secondary | ICD-10-CM | POA: Diagnosis not present

## 2017-02-15 DIAGNOSIS — E785 Hyperlipidemia, unspecified: Secondary | ICD-10-CM | POA: Diagnosis not present

## 2017-02-15 DIAGNOSIS — R7303 Prediabetes: Secondary | ICD-10-CM | POA: Diagnosis not present

## 2017-02-15 DIAGNOSIS — Z8673 Personal history of transient ischemic attack (TIA), and cerebral infarction without residual deficits: Secondary | ICD-10-CM | POA: Diagnosis not present

## 2017-02-15 DIAGNOSIS — Z0000 Encounter for general adult medical examination without abnormal findings: Secondary | ICD-10-CM | POA: Diagnosis not present

## 2017-02-15 DIAGNOSIS — Z720 Tobacco use: Secondary | ICD-10-CM | POA: Diagnosis not present

## 2017-02-18 DIAGNOSIS — R3121 Asymptomatic microscopic hematuria: Secondary | ICD-10-CM | POA: Diagnosis not present

## 2017-02-20 DIAGNOSIS — H6123 Impacted cerumen, bilateral: Secondary | ICD-10-CM | POA: Diagnosis not present

## 2017-07-03 DIAGNOSIS — R7303 Prediabetes: Secondary | ICD-10-CM | POA: Diagnosis not present

## 2017-07-03 DIAGNOSIS — E538 Deficiency of other specified B group vitamins: Secondary | ICD-10-CM | POA: Diagnosis not present

## 2017-07-03 DIAGNOSIS — Z720 Tobacco use: Secondary | ICD-10-CM | POA: Diagnosis not present

## 2017-07-03 DIAGNOSIS — Z8673 Personal history of transient ischemic attack (TIA), and cerebral infarction without residual deficits: Secondary | ICD-10-CM | POA: Diagnosis not present

## 2017-07-03 DIAGNOSIS — E785 Hyperlipidemia, unspecified: Secondary | ICD-10-CM | POA: Diagnosis not present

## 2017-07-30 DIAGNOSIS — Z01118 Encounter for examination of ears and hearing with other abnormal findings: Secondary | ICD-10-CM | POA: Diagnosis not present

## 2017-07-30 DIAGNOSIS — H538 Other visual disturbances: Secondary | ICD-10-CM | POA: Diagnosis not present

## 2017-07-30 DIAGNOSIS — Z136 Encounter for screening for cardiovascular disorders: Secondary | ICD-10-CM | POA: Diagnosis not present

## 2017-07-30 DIAGNOSIS — Z8673 Personal history of transient ischemic attack (TIA), and cerebral infarction without residual deficits: Secondary | ICD-10-CM | POA: Diagnosis not present

## 2017-07-30 DIAGNOSIS — Z131 Encounter for screening for diabetes mellitus: Secondary | ICD-10-CM | POA: Diagnosis not present

## 2017-07-30 DIAGNOSIS — E559 Vitamin D deficiency, unspecified: Secondary | ICD-10-CM | POA: Diagnosis not present

## 2017-07-30 DIAGNOSIS — E785 Hyperlipidemia, unspecified: Secondary | ICD-10-CM | POA: Diagnosis not present

## 2017-07-30 DIAGNOSIS — Z5181 Encounter for therapeutic drug level monitoring: Secondary | ICD-10-CM | POA: Diagnosis not present

## 2017-07-30 DIAGNOSIS — Z0000 Encounter for general adult medical examination without abnormal findings: Secondary | ICD-10-CM | POA: Diagnosis not present

## 2017-08-07 DIAGNOSIS — T162XXA Foreign body in left ear, initial encounter: Secondary | ICD-10-CM | POA: Diagnosis not present

## 2017-08-20 ENCOUNTER — Encounter: Payer: Self-pay | Admitting: Internal Medicine

## 2017-09-03 DIAGNOSIS — Z800 Family history of malignant neoplasm of digestive organs: Secondary | ICD-10-CM | POA: Diagnosis not present

## 2017-09-03 DIAGNOSIS — Z1211 Encounter for screening for malignant neoplasm of colon: Secondary | ICD-10-CM | POA: Diagnosis not present

## 2017-09-03 DIAGNOSIS — Z8673 Personal history of transient ischemic attack (TIA), and cerebral infarction without residual deficits: Secondary | ICD-10-CM | POA: Diagnosis not present

## 2017-09-25 DIAGNOSIS — K644 Residual hemorrhoidal skin tags: Secondary | ICD-10-CM | POA: Diagnosis not present

## 2017-09-25 DIAGNOSIS — Q438 Other specified congenital malformations of intestine: Secondary | ICD-10-CM | POA: Diagnosis not present

## 2017-09-25 DIAGNOSIS — Z1211 Encounter for screening for malignant neoplasm of colon: Secondary | ICD-10-CM | POA: Diagnosis not present

## 2017-12-12 DIAGNOSIS — E559 Vitamin D deficiency, unspecified: Secondary | ICD-10-CM | POA: Diagnosis not present

## 2017-12-12 DIAGNOSIS — R7303 Prediabetes: Secondary | ICD-10-CM | POA: Diagnosis not present

## 2017-12-12 DIAGNOSIS — Z720 Tobacco use: Secondary | ICD-10-CM | POA: Diagnosis not present

## 2017-12-12 DIAGNOSIS — E785 Hyperlipidemia, unspecified: Secondary | ICD-10-CM | POA: Diagnosis not present

## 2017-12-12 DIAGNOSIS — Z8673 Personal history of transient ischemic attack (TIA), and cerebral infarction without residual deficits: Secondary | ICD-10-CM | POA: Diagnosis not present

## 2018-02-17 DIAGNOSIS — Z0000 Encounter for general adult medical examination without abnormal findings: Secondary | ICD-10-CM | POA: Diagnosis not present

## 2018-02-17 DIAGNOSIS — E559 Vitamin D deficiency, unspecified: Secondary | ICD-10-CM | POA: Diagnosis not present

## 2018-02-17 DIAGNOSIS — E785 Hyperlipidemia, unspecified: Secondary | ICD-10-CM | POA: Diagnosis not present

## 2018-02-17 DIAGNOSIS — Z8673 Personal history of transient ischemic attack (TIA), and cerebral infarction without residual deficits: Secondary | ICD-10-CM | POA: Diagnosis not present

## 2018-02-17 DIAGNOSIS — Z720 Tobacco use: Secondary | ICD-10-CM | POA: Diagnosis not present

## 2018-07-31 DIAGNOSIS — Z131 Encounter for screening for diabetes mellitus: Secondary | ICD-10-CM | POA: Diagnosis not present

## 2018-07-31 DIAGNOSIS — Z0000 Encounter for general adult medical examination without abnormal findings: Secondary | ICD-10-CM | POA: Diagnosis not present

## 2018-07-31 DIAGNOSIS — Z01118 Encounter for examination of ears and hearing with other abnormal findings: Secondary | ICD-10-CM | POA: Diagnosis not present

## 2018-07-31 DIAGNOSIS — Z125 Encounter for screening for malignant neoplasm of prostate: Secondary | ICD-10-CM | POA: Diagnosis not present

## 2018-07-31 DIAGNOSIS — E785 Hyperlipidemia, unspecified: Secondary | ICD-10-CM | POA: Diagnosis not present

## 2018-07-31 DIAGNOSIS — Z720 Tobacco use: Secondary | ICD-10-CM | POA: Diagnosis not present

## 2018-07-31 DIAGNOSIS — H538 Other visual disturbances: Secondary | ICD-10-CM | POA: Diagnosis not present

## 2018-08-21 DIAGNOSIS — R7303 Prediabetes: Secondary | ICD-10-CM | POA: Diagnosis not present

## 2018-08-21 DIAGNOSIS — Z8673 Personal history of transient ischemic attack (TIA), and cerebral infarction without residual deficits: Secondary | ICD-10-CM | POA: Diagnosis not present

## 2018-08-21 DIAGNOSIS — E785 Hyperlipidemia, unspecified: Secondary | ICD-10-CM | POA: Diagnosis not present

## 2018-08-21 DIAGNOSIS — E538 Deficiency of other specified B group vitamins: Secondary | ICD-10-CM | POA: Diagnosis not present

## 2018-10-28 DIAGNOSIS — Z720 Tobacco use: Secondary | ICD-10-CM | POA: Diagnosis not present

## 2018-10-28 DIAGNOSIS — R7303 Prediabetes: Secondary | ICD-10-CM | POA: Diagnosis not present

## 2018-10-28 DIAGNOSIS — Z8673 Personal history of transient ischemic attack (TIA), and cerebral infarction without residual deficits: Secondary | ICD-10-CM | POA: Diagnosis not present

## 2018-10-28 DIAGNOSIS — E7800 Pure hypercholesterolemia, unspecified: Secondary | ICD-10-CM | POA: Diagnosis not present

## 2018-10-28 DIAGNOSIS — I10 Essential (primary) hypertension: Secondary | ICD-10-CM | POA: Diagnosis not present

## 2019-02-03 DIAGNOSIS — I10 Essential (primary) hypertension: Secondary | ICD-10-CM | POA: Diagnosis not present

## 2019-02-03 DIAGNOSIS — E7800 Pure hypercholesterolemia, unspecified: Secondary | ICD-10-CM | POA: Diagnosis not present

## 2019-02-03 DIAGNOSIS — Z8673 Personal history of transient ischemic attack (TIA), and cerebral infarction without residual deficits: Secondary | ICD-10-CM | POA: Diagnosis not present

## 2019-02-03 DIAGNOSIS — R7303 Prediabetes: Secondary | ICD-10-CM | POA: Diagnosis not present

## 2019-02-03 DIAGNOSIS — M545 Low back pain: Secondary | ICD-10-CM | POA: Diagnosis not present

## 2019-02-03 DIAGNOSIS — Z720 Tobacco use: Secondary | ICD-10-CM | POA: Diagnosis not present

## 2019-02-24 DIAGNOSIS — Z131 Encounter for screening for diabetes mellitus: Secondary | ICD-10-CM | POA: Diagnosis not present

## 2019-02-24 DIAGNOSIS — I10 Essential (primary) hypertension: Secondary | ICD-10-CM | POA: Diagnosis not present

## 2019-02-24 DIAGNOSIS — Z0000 Encounter for general adult medical examination without abnormal findings: Secondary | ICD-10-CM | POA: Diagnosis not present

## 2019-02-24 DIAGNOSIS — Z720 Tobacco use: Secondary | ICD-10-CM | POA: Diagnosis not present

## 2019-02-24 DIAGNOSIS — E7800 Pure hypercholesterolemia, unspecified: Secondary | ICD-10-CM | POA: Diagnosis not present

## 2019-06-11 DIAGNOSIS — Z8673 Personal history of transient ischemic attack (TIA), and cerebral infarction without residual deficits: Secondary | ICD-10-CM | POA: Diagnosis not present

## 2019-06-11 DIAGNOSIS — I10 Essential (primary) hypertension: Secondary | ICD-10-CM | POA: Diagnosis not present

## 2019-06-11 DIAGNOSIS — Z720 Tobacco use: Secondary | ICD-10-CM | POA: Diagnosis not present

## 2019-06-11 DIAGNOSIS — R7303 Prediabetes: Secondary | ICD-10-CM | POA: Diagnosis not present

## 2019-06-11 DIAGNOSIS — E7800 Pure hypercholesterolemia, unspecified: Secondary | ICD-10-CM | POA: Diagnosis not present

## 2019-07-08 ENCOUNTER — Ambulatory Visit (HOSPITAL_BASED_OUTPATIENT_CLINIC_OR_DEPARTMENT_OTHER): Admit: 2019-07-08 | Payer: Medicare Other | Admitting: Oral Surgery

## 2019-07-08 ENCOUNTER — Encounter (HOSPITAL_BASED_OUTPATIENT_CLINIC_OR_DEPARTMENT_OTHER): Payer: Self-pay

## 2019-07-08 SURGERY — OSTEOTOMY, MAXILLA
Anesthesia: General | Laterality: Bilateral

## 2019-07-23 DIAGNOSIS — Z8673 Personal history of transient ischemic attack (TIA), and cerebral infarction without residual deficits: Secondary | ICD-10-CM | POA: Diagnosis not present

## 2019-07-23 DIAGNOSIS — I10 Essential (primary) hypertension: Secondary | ICD-10-CM | POA: Diagnosis not present

## 2019-07-23 DIAGNOSIS — Z720 Tobacco use: Secondary | ICD-10-CM | POA: Diagnosis not present

## 2019-07-23 DIAGNOSIS — E7800 Pure hypercholesterolemia, unspecified: Secondary | ICD-10-CM | POA: Diagnosis not present

## 2019-07-23 DIAGNOSIS — R7303 Prediabetes: Secondary | ICD-10-CM | POA: Diagnosis not present

## 2019-08-03 DIAGNOSIS — Z0100 Encounter for examination of eyes and vision without abnormal findings: Secondary | ICD-10-CM | POA: Diagnosis not present

## 2019-08-03 DIAGNOSIS — I10 Essential (primary) hypertension: Secondary | ICD-10-CM | POA: Diagnosis not present

## 2019-08-03 DIAGNOSIS — Z0000 Encounter for general adult medical examination without abnormal findings: Secondary | ICD-10-CM | POA: Diagnosis not present

## 2019-08-03 DIAGNOSIS — Z131 Encounter for screening for diabetes mellitus: Secondary | ICD-10-CM | POA: Diagnosis not present

## 2019-08-27 DIAGNOSIS — H60333 Swimmer's ear, bilateral: Secondary | ICD-10-CM | POA: Diagnosis not present

## 2019-08-27 DIAGNOSIS — H6123 Impacted cerumen, bilateral: Secondary | ICD-10-CM | POA: Diagnosis not present

## 2019-08-28 DIAGNOSIS — E559 Vitamin D deficiency, unspecified: Secondary | ICD-10-CM | POA: Diagnosis not present

## 2019-08-28 DIAGNOSIS — Z8673 Personal history of transient ischemic attack (TIA), and cerebral infarction without residual deficits: Secondary | ICD-10-CM | POA: Diagnosis not present

## 2019-08-28 DIAGNOSIS — E7800 Pure hypercholesterolemia, unspecified: Secondary | ICD-10-CM | POA: Diagnosis not present

## 2019-08-28 DIAGNOSIS — I10 Essential (primary) hypertension: Secondary | ICD-10-CM | POA: Diagnosis not present

## 2019-08-28 DIAGNOSIS — Z720 Tobacco use: Secondary | ICD-10-CM | POA: Diagnosis not present

## 2019-09-10 DIAGNOSIS — H9072 Mixed conductive and sensorineural hearing loss, unilateral, left ear, with unrestricted hearing on the contralateral side: Secondary | ICD-10-CM | POA: Diagnosis not present

## 2019-09-10 DIAGNOSIS — H60333 Swimmer's ear, bilateral: Secondary | ICD-10-CM | POA: Diagnosis not present

## 2019-09-10 DIAGNOSIS — H9041 Sensorineural hearing loss, unilateral, right ear, with unrestricted hearing on the contralateral side: Secondary | ICD-10-CM | POA: Diagnosis not present

## 2019-09-10 DIAGNOSIS — H6123 Impacted cerumen, bilateral: Secondary | ICD-10-CM | POA: Diagnosis not present

## 2019-10-09 DIAGNOSIS — H905 Unspecified sensorineural hearing loss: Secondary | ICD-10-CM | POA: Diagnosis not present

## 2019-11-02 ENCOUNTER — Ambulatory Visit (INDEPENDENT_AMBULATORY_CARE_PROVIDER_SITE_OTHER): Payer: Medicare Other | Admitting: Otolaryngology

## 2019-11-06 ENCOUNTER — Encounter (INDEPENDENT_AMBULATORY_CARE_PROVIDER_SITE_OTHER): Payer: Self-pay | Admitting: Otolaryngology

## 2019-11-06 ENCOUNTER — Other Ambulatory Visit: Payer: Self-pay

## 2019-11-06 ENCOUNTER — Ambulatory Visit (INDEPENDENT_AMBULATORY_CARE_PROVIDER_SITE_OTHER): Payer: Medicare Other | Admitting: Otolaryngology

## 2019-11-06 VITALS — Temp 97.3°F

## 2019-11-06 DIAGNOSIS — H6123 Impacted cerumen, bilateral: Secondary | ICD-10-CM

## 2019-11-06 DIAGNOSIS — H60312 Diffuse otitis externa, left ear: Secondary | ICD-10-CM

## 2019-11-06 DIAGNOSIS — H61302 Acquired stenosis of left external ear canal, unspecified: Secondary | ICD-10-CM

## 2019-11-06 NOTE — Progress Notes (Signed)
HPI: Charles Russell is a 73 y.o. male who returns today for evaluation of cough left ear blockage referred by [facility].  He has bilateral hearing aids.  Over the past year he has had worse hearing in the left side has had intermittent drainage from the left ear.  He has been seen previously and treated with Cortisporin eardrops which seemed to help.  He also has itching down in his ears.  He has had history of ear canal stenosis..  Past Medical History:  Diagnosis Date  . Stroke [facility]) 2005   or 2006   Past Surgical History:  Procedure Laterality Date  . NECK SURGERY  2005  . PROSTATE SURGERY  2009   Dr. Jeffie Pollock- Robotic surgery   Social History   Socioeconomic History  . Marital status: Single    Spouse name: Not on file  . Number of children: Not on file  . Years of education: Not on file  . Highest education level: Not on file  Occupational History  . Not on file  Social Needs  . Financial resource strain: Not on file  . Food insecurity    Worry: Not on file    Inability: Not on file  . Transportation needs    Medical: Not on file    Non-medical: Not on file  Tobacco Use  . Smoking status: Current Every Day Smoker    Packs/day: 0.33    Types: Cigarettes    Start date: 76  . Smokeless tobacco: Never Used  Substance and Sexual Activity  . Alcohol use: No    Comment: occasional  . Drug use: Yes    Frequency: 7.0 times per week    Types: Marijuana  . Sexual activity: Never  Lifestyle  . Physical activity    Days per week: Not on file    Minutes per session: Not on file  . Stress: Not on file  Relationships  . Social Herbalist on phone: Not on file    Gets together: Not on file    Attends religious service: Not on file    Active member of club or organization: Not on file    Attends meetings of clubs or organizations: Not on file    Relationship status: Not on file  Other Topics Concern  . Not on file  Social History Narrative  . Not on  file   Family History  Problem Relation Age of Onset  . Heart disease Mother   . Tuberculosis Mother   . Heart disease Father   . Cancer Father        colon  . Colon cancer Father   . Alcohol abuse Brother        cirrohsis of liver   No Known Allergies Prior to Admission medications   Medication Sig Start Date End Date Taking? Authorizing Provider  acetic acid-hydrocortisone (VOSOL-HC) otic solution Place 5 drops into both ears 3 (three) times daily. 01/21/15  Yes Presson, Audelia Hives, PA  SUPREP BOWEL PREP SOLN Take 1 kit by mouth once. 06/10/14  Yes Gatha Mayer, MD     Positive ROS: Otherwise negative  All other systems have been reviewed and were otherwise negative with the exception of those mentioned in the HPI and as above.  Physical Exam: General: Alert, no acute distress Ears: He has small ear canals bilaterally.  His better hearing ear which is his right ear is very stenotic but the TM is intact I was able  to clean some wax adjacent to the TM with 5 suction.  AC was greater than BC on the right side.  The left ear canal was also stenotic was more inflammatory changes with what appears to be a small TM perforation.  I am only able to visualize the posterior one third of the TM.  After cleaning the ear canal with suction I applied CSF powder. Nasal: Clear nasal passages.  Septal deviation to the right with moderate rhinitis.  Middle meatus regions clear with no active infection noted. Oral: Clear oropharynx Neck: No palpable adenopathy or masses  Cerumen impaction removal  Date/Time: 11/06/2019 2:39 PM Performed by: Rozetta Nunnery, MD Authorized by: Rozetta Nunnery, MD   Consent:    Consent obtained:  Verbal   Consent given by:  Patient   Risks discussed:  Pain and bleeding Procedure details:    Location:  L ear and R ear   Procedure type: suction and forceps   Post-procedure details:    Post-procedure ear inspection: Right TM appears normal but  only able to visualize posterior right TM.  Left TM is difficult to visualize but appears to have a small perforation.   Hearing quality:  Improved   Patient tolerance of procedure:  Tolerated well, no immediate complications Comments:     Patient has stenotic ear canals bilaterally with slight inflammatory changes of the left ear canal.  Small perforation of the left TM    Assessment: Bilateral ear canal stenosis Mild left external otitis Left TM perforation with chronic otitis and conductive hearing loss  Plan: Placed him on Augmentin 875 mg twice daily for 10 days Cortisporin otic suspension drops 4 drops twice daily for 1 week Nasacort 2 sprays each nostril at night He will follow-up in 2 to 3 weeks for recheck.   Radene Journey, MD

## 2019-11-25 ENCOUNTER — Other Ambulatory Visit: Payer: Self-pay

## 2019-11-25 ENCOUNTER — Ambulatory Visit (INDEPENDENT_AMBULATORY_CARE_PROVIDER_SITE_OTHER): Payer: Medicare Other | Admitting: Otolaryngology

## 2019-11-25 ENCOUNTER — Encounter (INDEPENDENT_AMBULATORY_CARE_PROVIDER_SITE_OTHER): Payer: Self-pay | Admitting: Otolaryngology

## 2019-11-25 VITALS — Temp 97.5°F

## 2019-11-25 DIAGNOSIS — H60312 Diffuse otitis externa, left ear: Secondary | ICD-10-CM

## 2019-11-25 DIAGNOSIS — H7292 Unspecified perforation of tympanic membrane, left ear: Secondary | ICD-10-CM

## 2019-11-25 NOTE — Progress Notes (Signed)
HPI: Charles Russell is a 73 y.o. male who returns today for evaluation of chronic ear problems.  He wears bilateral hearing aids.  He has chronically stenotic ear canals.  More recently has been having problems with drainage in the left ear.  He denies any ear pain today.  He has been using antibiotic eardrops in the left ear.  He has had history of chronic external otitis with ear canal stenosis.  [facility]..  Past Medical History:  Diagnosis Date  . Stroke [facility]) 2005   or 2006   Past Surgical History:  Procedure Laterality Date  . NECK SURGERY  2005  . PROSTATE SURGERY  2009   Dr. Jeffie Pollock- Robotic surgery   Social History   Socioeconomic History  . Marital status: Single    Spouse name: Not on file  . Number of children: Not on file  . Years of education: Not on file  . Highest education level: Not on file  Occupational History  . Not on file  Social Needs  . Financial resource strain: Not on file  . Food insecurity    Worry: Not on file    Inability: Not on file  . Transportation needs    Medical: Not on file    Non-medical: Not on file  Tobacco Use  . Smoking status: Current Every Day Smoker    Packs/day: 0.33    Years: 55.00    Pack years: 18.15    Types: Cigarettes    Start date: 73  . Smokeless tobacco: Never Used  Substance and Sexual Activity  . Alcohol use: No    Comment: occasional  . Drug use: Yes    Frequency: 7.0 times per week    Types: Marijuana  . Sexual activity: Never  Lifestyle  . Physical activity    Days per week: Not on file    Minutes per session: Not on file  . Stress: Not on file  Relationships  . Social Herbalist on phone: Not on file    Gets together: Not on file    Attends religious service: Not on file    Active member of club or organization: Not on file    Attends meetings of clubs or organizations: Not on file    Relationship status: Not on file  Other Topics  Concern  . Not on file  Social History Narrative  . Not on file   Family History  Problem Relation Age of Onset  . Heart disease Mother   . Tuberculosis Mother   . Heart disease Father   . Cancer Father        colon  . Colon cancer Father   . Alcohol abuse Brother        cirrohsis of liver   No Known Allergies Prior to Admission medications   Medication Sig Start Date End Date Taking? Authorizing Provider  acetic acid-hydrocortisone (VOSOL-HC) otic solution Place 5 drops into both ears 3 (three) times daily. 01/21/15  Yes Presson, Audelia Hives, PA  SUPREP BOWEL PREP SOLN Take 1 kit by mouth once. 06/10/14  Yes Gatha Mayer, MD     Positive ROS: Otherwise negative  All other systems have been reviewed and were otherwise negative with the exception of those mentioned in the HPI and as above.  Physical Exam: Constitutional: Alert, well-appearing, no acute distress Ears: He is very stenotic in both ear canals.  The right ear canal  is fairly clear and the TM can be visualized.  He is wearing his hearing aid in the right ear.  He has not been wearing his hearing aid in the left ear and has been using eardrops.  On the left side he again has a very stenotic canal can barely visualize the TM he has what appears to be a small perforation but no active drainage presently.  After cleaning the ear canal I applied gentian violet, Ciprodex and CSF powder. Nasal: External nose without lesions. Septum relatively midline. Clear nasal passages.  No signs of infection. Oral: Lips and gums without lesions. Tongue and palate mucosa without lesions. Posterior oropharynx clear. Neck: No palpable adenopathy or masses Respiratory: Breathing comfortably  Skin: No facial/neck lesions or rash noted.  Cerumen impaction removal  Date/Time: 11/25/2019 1:59 PM Performed by: Rozetta Nunnery, MD Authorized by: Rozetta Nunnery, MD   Consent:    Consent obtained:  Verbal   Consent given by:   Patient   Risks discussed:  Pain and bleeding Procedure details:    Location:  L ear and R ear   Procedure type: suction and forceps   Post-procedure details:    Post-procedure ear inspection: Both ear canals very stenotic left worse than right.   Hearing quality:  Improved   Patient tolerance of procedure:  Tolerated well, no immediate complications Comments:     Patient with chronically stenotic ear canals.  On the left side where he has had drainage previously I cleaned this with suction and applied gentian violet, Ciprodex and CSF powder.    Assessment: Bilateral ear canal stenosis Chronic external otitis Apparent left TM perforation Sensorineural hearing loss bilaterally  Plan: Recommended using antibiotic eardrops which he already has if he has any drainage from the left ear. He will follow-up in 6 weeks for recheck and cleaning. Recommended leaving the hearing aid out if he has having drainage.   Radene Journey, MD

## 2019-12-15 ENCOUNTER — Other Ambulatory Visit: Payer: Self-pay

## 2019-12-15 NOTE — Patient Outreach (Signed)
Charles Russell) Care Management  12/15/2019  Charles Russell 12/12/1946 [id number]   Medication Adherence call to Charles Russell [facility] Identifiers Verify spoke with patient he is past due on Atorvastatin 10 mg,he explain he takes 1 tablet daily,patient has enough for another month,[facility] explain [facility] reason why he still has medication At this time. Charles Russell is showing past due under [place].   [facility] Management Direct Dial [phone]  Fax [phone] [email or web address]

## 2019-12-23 DIAGNOSIS — Z719 Counseling, unspecified: Secondary | ICD-10-CM | POA: Diagnosis not present

## 2020-01-06 ENCOUNTER — Ambulatory Visit (INDEPENDENT_AMBULATORY_CARE_PROVIDER_SITE_OTHER): Payer: Medicare Other | Admitting: Otolaryngology

## 2020-01-15 ENCOUNTER — Ambulatory Visit (INDEPENDENT_AMBULATORY_CARE_PROVIDER_SITE_OTHER): Payer: Medicare Other | Admitting: Otolaryngology

## 2020-01-15 ENCOUNTER — Other Ambulatory Visit: Payer: Self-pay

## 2020-01-15 VITALS — Temp 98.1°F

## 2020-01-15 DIAGNOSIS — H903 Sensorineural hearing loss, bilateral: Secondary | ICD-10-CM | POA: Diagnosis not present

## 2020-01-15 DIAGNOSIS — H60313 Diffuse otitis externa, bilateral: Secondary | ICD-10-CM

## 2020-01-15 NOTE — Progress Notes (Signed)
HPI: Charles Russell is a 74 y.o. male who returns today for evaluation of ears.  [facility] and wears [facility].  He has moderate severe hearing loss in both ears.  He also has history of chronic external otitis and very stenotic ear canals bilaterally.  Patient apparently has had problems with ear infections for over 3 years.  I initially saw him back in September of last year.  He is always heard better from his right ear and apparently has a chronic conductive loss on the left side. Since last visit he has had no further drainage from his left ear according to him.  He is having no pain or discomfort..  Past Medical History:  Diagnosis Date  . Stroke [facility]) 2005   or 2006   Past Surgical History:  Procedure Laterality Date  . NECK SURGERY  2005  . PROSTATE SURGERY  2009   Dr. Jeffie Russell- Robotic surgery   Social History   Socioeconomic History  . Marital status: Single    Spouse name: Not on file  . Number of children: Not on file  . Years of education: Not on file  . Highest education level: Not on file  Occupational History  . Not on file  Tobacco Use  . Smoking status: Current Every Day Smoker    Packs/day: 0.33    Years: 55.00    Pack years: 18.15    Types: Cigarettes    Start date: 40  . Smokeless tobacco: Never Used  Substance and Sexual Activity  . Alcohol use: No    Comment: occasional  . Drug use: Yes    Frequency: 7.0 times per week    Types: Marijuana  . Sexual activity: Never  Other Topics Concern  . Not on file  Social History Narrative  . Not on file   Social Determinants of Health   Financial Resource Strain:   . Difficulty of Paying Living Expenses: Not on file  Food Insecurity:   . Worried About Charity fundraiser in the Last Year: Not on file  . Ran Out of Food in the Last Year: Not on file  Transportation Needs:   . Lack of Transportation (Medical): Not on file  . Lack of Transportation (Non-Medical): Not  on file  Physical Activity:   . Days of Exercise per Week: Not on file  . Minutes of Exercise per Session: Not on file  Stress:   . Feeling of Stress : Not on file  Social Connections:   . Frequency of Communication with Friends and Family: Not on file  . Frequency of Social Gatherings with Friends and Family: Not on file  . Attends Religious Services: Not on file  . Active Member of Clubs or Organizations: Not on file  . Attends Archivist Meetings: Not on file  . Marital Status: Not on file   Family History  Problem Relation Age of Onset  . Heart disease Mother   . Tuberculosis Mother   . Heart disease Father   . Cancer Father        colon  . Colon cancer Father   . Alcohol abuse Brother        cirrohsis of liver   No Known Allergies Prior to Admission medications   Medication Sig Start Date End Date Taking? Authorizing Provider  acetic acid-hydrocortisone (VOSOL-HC) otic solution Place 5 drops into both ears 3 (three) times daily. 01/21/15   Presson, Audelia Hives, PA  SUPREP BOWEL PREP SOLN Take 1 kit by mouth once. 06/10/14   Gatha Mayer, MD     Positive ROS: Negative  All other systems have been reviewed and were otherwise negative with the exception of those mentioned in the HPI and as above.  Physical Exam: Constitutional: Alert, well-appearing, no acute distress Ears: External ears without lesions or tenderness.  He has chronic stenotic ear canals with chronic external otitis bilaterally.  The right ear canal was able to be cleaned with suction and the TM appeared okay.  Left ear canal is smaller with very limited assessment of the left TM with what appears to be a small TM perforation but no active drainage noted presently.  I applied gentian violet to the left ear only.  Applied CSF powder to the right ear. Nasal: External nose without lesions. Septum relatively midline.. Clear nasal passages Oral: Lips and gums without lesions. Tongue and palate  mucosa without lesions. Posterior oropharynx clear. Neck: No palpable adenopathy or masses Respiratory: Breathing comfortably  Skin: No facial/neck lesions or rash noted.  Cerumen impaction removal  Date/Time: 01/15/2020 5:52 PM Performed by: Rozetta Nunnery, MD Authorized by: Rozetta Nunnery, MD   Consent:    Consent obtained:  Verbal   Consent given by:  Patient   Risks discussed:  Pain and bleeding Procedure details:    Location:  L ear and R ear   Procedure type: suction   Post-procedure details:    Hearing quality:  Improved   Patient tolerance of procedure:  Tolerated well, no immediate complications Comments:     Both ear canals were cleaned with suction in the office today.  He has chronic external otitis with very stenotic ear canals.  I applied CSF powder to the right ear and gentian violet and CSF powder to the left ear.    Assessment: Chronic bilateral external otitis with stenotic ear canals. Sensorineural hearing loss with additional conductive loss on the left side.  Plan: Recommended use of Cortisporin otic suspension drops in his ears whenever he is having drainage. He will follow-up in 3 to 4 months for recheck and cleaning the ears. He is okay to use the hearing aids whenever he is not having any drainage from the ears.   Radene Journey, MD

## 2020-02-03 DIAGNOSIS — Z8673 Personal history of transient ischemic attack (TIA), and cerebral infarction without residual deficits: Secondary | ICD-10-CM | POA: Diagnosis not present

## 2020-02-03 DIAGNOSIS — I10 Essential (primary) hypertension: Secondary | ICD-10-CM | POA: Diagnosis not present

## 2020-02-03 DIAGNOSIS — Z720 Tobacco use: Secondary | ICD-10-CM | POA: Diagnosis not present

## 2020-02-03 DIAGNOSIS — Z131 Encounter for screening for diabetes mellitus: Secondary | ICD-10-CM | POA: Diagnosis not present

## 2020-02-03 DIAGNOSIS — E559 Vitamin D deficiency, unspecified: Secondary | ICD-10-CM | POA: Diagnosis not present

## 2020-02-03 DIAGNOSIS — E7800 Pure hypercholesterolemia, unspecified: Secondary | ICD-10-CM | POA: Diagnosis not present

## 2020-03-09 DIAGNOSIS — Z8673 Personal history of transient ischemic attack (TIA), and cerebral infarction without residual deficits: Secondary | ICD-10-CM | POA: Diagnosis not present

## 2020-03-09 DIAGNOSIS — E7800 Pure hypercholesterolemia, unspecified: Secondary | ICD-10-CM | POA: Diagnosis not present

## 2020-03-09 DIAGNOSIS — Z0001 Encounter for general adult medical examination with abnormal findings: Secondary | ICD-10-CM | POA: Diagnosis not present

## 2020-03-09 DIAGNOSIS — E559 Vitamin D deficiency, unspecified: Secondary | ICD-10-CM | POA: Diagnosis not present

## 2020-03-09 DIAGNOSIS — I10 Essential (primary) hypertension: Secondary | ICD-10-CM | POA: Diagnosis not present

## 2020-03-09 DIAGNOSIS — Z720 Tobacco use: Secondary | ICD-10-CM | POA: Diagnosis not present

## 2020-03-30 ENCOUNTER — Encounter (INDEPENDENT_AMBULATORY_CARE_PROVIDER_SITE_OTHER): Payer: Self-pay | Admitting: Otolaryngology

## 2020-03-30 ENCOUNTER — Other Ambulatory Visit: Payer: Self-pay

## 2020-03-30 ENCOUNTER — Ambulatory Visit (INDEPENDENT_AMBULATORY_CARE_PROVIDER_SITE_OTHER): Payer: Medicare Other | Admitting: Otolaryngology

## 2020-03-30 VITALS — Temp 96.4°F

## 2020-03-30 DIAGNOSIS — H61303 Acquired stenosis of external ear canal, unspecified, bilateral: Secondary | ICD-10-CM

## 2020-03-30 NOTE — Progress Notes (Signed)
HPI: Charles Russell is a 74 y.o. male who presents for evaluation of ear canals.  Patient has a long history of very stenotic ear canals bilaterally because of chronic external otitis.  He wears bilateral hearing aids but needs a new one for the left ear canal.  However had infection of the left ear canal that needed to be cleared up before we can get a new hearing aid.  He is having no drainage from the ears presently and the ears are feeling better to the patient..  Past Medical History:  Diagnosis Date  . Stroke [facility]) 2005   or 2006   Past Surgical History:  Procedure Laterality Date  . NECK SURGERY  2005  . PROSTATE SURGERY  2009   Dr. Jeffie Pollock- Robotic surgery   Social History   Socioeconomic History  . Marital status: Single    Spouse name: Not on file  . Number of children: Not on file  . Years of education: Not on file  . Highest education level: Not on file  Occupational History  . Not on file  Tobacco Use  . Smoking status: Current Every Day Smoker    Packs/day: 0.33    Years: 55.00    Pack years: 18.15    Types: Cigarettes    Start date: 56  . Smokeless tobacco: Never Used  Substance and Sexual Activity  . Alcohol use: No    Comment: occasional  . Drug use: Yes    Frequency: 7.0 times per week    Types: Marijuana  . Sexual activity: Never  Other Topics Concern  . Not on file  Social History Narrative  . Not on file   Social Determinants of Health   Financial Resource Strain:   . Difficulty of Paying Living Expenses:   Food Insecurity:   . Worried About Charity fundraiser in the Last Year:   . Arboriculturist in the Last Year:   Transportation Needs:   . Film/video editor (Medical):   Marland Kitchen Lack of Transportation (Non-Medical):   Physical Activity:   . Days of Exercise per Week:   . Minutes of Exercise per Session:   Stress:   . Feeling of Stress :   Social Connections:   . Frequency of Communication with Friends and Family:   . Frequency of  Social Gatherings with Friends and Family:   . Attends Religious Services:   . Active Member of Clubs or Organizations:   . Attends Archivist Meetings:   Marland Kitchen Marital Status:    Family History  Problem Relation Age of Onset  . Heart disease Mother   . Tuberculosis Mother   . Heart disease Father   . Cancer Father        colon  . Colon cancer Father   . Alcohol abuse Brother        cirrohsis of liver   No Known Allergies Prior to Admission medications   Medication Sig Start Date End Date Taking? Authorizing Provider  acetic acid-hydrocortisone (VOSOL-HC) otic solution Place 5 drops into both ears 3 (three) times daily. 01/21/15  Yes Presson, Audelia Hives, PA  SUPREP BOWEL PREP SOLN Take 1 kit by mouth once. 06/10/14  Yes Gatha Mayer, MD     Positive ROS: Otherwise negative  All other systems have been reviewed and were otherwise negative with the exception of those mentioned in the HPI and as above.  Physical Exam: Constitutional: Alert, well-appearing, no acute distress Ears:  External ears without lesions or tenderness. Ear canals are stenotic bilaterally.  He had minimal debris within the ears that was cleaned with suction in small forceps.  He appears to have a small left TM perforation with stenosis of the ear canal making visualization of this rather difficult.  There is no active drainage noted.  I applied CSF powder to both ear canals.. Nasal: External nose without lesions. Clear nasal passages Oral: Oropharynx clear. Neck: No palpable adenopathy or masses Respiratory: Breathing comfortably  Skin: No facial/neck lesions or rash noted.  Cerumen impaction removal  Date/Time: 03/30/2020 1:44 PM Performed by: Rozetta Nunnery, MD Authorized by: Rozetta Nunnery, MD   Consent:    Consent obtained:  Verbal   Consent given by:  Patient   Risks discussed:  Pain and bleeding Procedure details:    Location:  L ear and R ear   Procedure type: suction  and forceps   Post-procedure details:    Inspection:  TM intact and canal normal   Hearing quality:  Improved   Patient tolerance of procedure:  Tolerated well, no immediate complications Comments:     Patient with bilateral stenotic ear canals bilaterally secondary to chronic external otitis.  Patient also with a small perforation of the left TM but no active drainage noted today.    Assessment: Bilateral ear canal stenosis History of external otitis  Plan: Ear canals are appearing much better today.  He is wearing a hearing aid in the right ear and doing well with this. He is cleared to get a hearing aid in the left ear.  Radene Journey, MD

## 2020-08-03 DIAGNOSIS — E559 Vitamin D deficiency, unspecified: Secondary | ICD-10-CM | POA: Diagnosis not present

## 2020-08-03 DIAGNOSIS — E7800 Pure hypercholesterolemia, unspecified: Secondary | ICD-10-CM | POA: Diagnosis not present

## 2020-08-03 DIAGNOSIS — Z8673 Personal history of transient ischemic attack (TIA), and cerebral infarction without residual deficits: Secondary | ICD-10-CM | POA: Diagnosis not present

## 2020-08-03 DIAGNOSIS — Z720 Tobacco use: Secondary | ICD-10-CM | POA: Diagnosis not present

## 2020-08-03 DIAGNOSIS — I10 Essential (primary) hypertension: Secondary | ICD-10-CM | POA: Diagnosis not present

## 2021-02-01 DIAGNOSIS — J302 Other seasonal allergic rhinitis: Secondary | ICD-10-CM | POA: Diagnosis not present

## 2021-02-01 DIAGNOSIS — R7303 Prediabetes: Secondary | ICD-10-CM | POA: Diagnosis not present

## 2021-02-01 DIAGNOSIS — E538 Deficiency of other specified B group vitamins: Secondary | ICD-10-CM | POA: Diagnosis not present

## 2021-02-01 DIAGNOSIS — Z8673 Personal history of transient ischemic attack (TIA), and cerebral infarction without residual deficits: Secondary | ICD-10-CM | POA: Diagnosis not present

## 2021-02-01 DIAGNOSIS — I10 Essential (primary) hypertension: Secondary | ICD-10-CM | POA: Diagnosis not present

## 2021-02-01 DIAGNOSIS — E559 Vitamin D deficiency, unspecified: Secondary | ICD-10-CM | POA: Diagnosis not present

## 2021-02-01 DIAGNOSIS — Z720 Tobacco use: Secondary | ICD-10-CM | POA: Diagnosis not present

## 2021-02-01 DIAGNOSIS — E7800 Pure hypercholesterolemia, unspecified: Secondary | ICD-10-CM | POA: Diagnosis not present

## 2021-08-02 DIAGNOSIS — E559 Vitamin D deficiency, unspecified: Secondary | ICD-10-CM | POA: Diagnosis not present

## 2021-08-02 DIAGNOSIS — Z0001 Encounter for general adult medical examination with abnormal findings: Secondary | ICD-10-CM | POA: Diagnosis not present

## 2021-08-02 DIAGNOSIS — I10 Essential (primary) hypertension: Secondary | ICD-10-CM | POA: Diagnosis not present

## 2021-08-02 DIAGNOSIS — E538 Deficiency of other specified B group vitamins: Secondary | ICD-10-CM | POA: Diagnosis not present

## 2021-08-02 DIAGNOSIS — Z8673 Personal history of transient ischemic attack (TIA), and cerebral infarction without residual deficits: Secondary | ICD-10-CM | POA: Diagnosis not present

## 2021-08-02 DIAGNOSIS — R7303 Prediabetes: Secondary | ICD-10-CM | POA: Diagnosis not present

## 2021-08-02 DIAGNOSIS — Z720 Tobacco use: Secondary | ICD-10-CM | POA: Diagnosis not present

## 2021-08-02 DIAGNOSIS — E7800 Pure hypercholesterolemia, unspecified: Secondary | ICD-10-CM | POA: Diagnosis not present

## 2021-10-02 DIAGNOSIS — Z720 Tobacco use: Secondary | ICD-10-CM | POA: Diagnosis not present

## 2021-10-02 DIAGNOSIS — Z8673 Personal history of transient ischemic attack (TIA), and cerebral infarction without residual deficits: Secondary | ICD-10-CM | POA: Diagnosis not present

## 2021-10-02 DIAGNOSIS — E538 Deficiency of other specified B group vitamins: Secondary | ICD-10-CM | POA: Diagnosis not present

## 2021-10-02 DIAGNOSIS — I10 Essential (primary) hypertension: Secondary | ICD-10-CM | POA: Diagnosis not present

## 2021-10-02 DIAGNOSIS — R7303 Prediabetes: Secondary | ICD-10-CM | POA: Diagnosis not present

## 2021-10-02 DIAGNOSIS — E559 Vitamin D deficiency, unspecified: Secondary | ICD-10-CM | POA: Diagnosis not present

## 2021-10-02 DIAGNOSIS — M62838 Other muscle spasm: Secondary | ICD-10-CM | POA: Diagnosis not present

## 2021-10-02 DIAGNOSIS — E7800 Pure hypercholesterolemia, unspecified: Secondary | ICD-10-CM | POA: Diagnosis not present

## 2021-10-17 DIAGNOSIS — H61303 Acquired stenosis of external ear canal, unspecified, bilateral: Secondary | ICD-10-CM | POA: Diagnosis not present

## 2021-10-17 DIAGNOSIS — T162XXA Foreign body in left ear, initial encounter: Secondary | ICD-10-CM | POA: Diagnosis not present

## 2021-10-17 DIAGNOSIS — Z974 Presence of external hearing-aid: Secondary | ICD-10-CM | POA: Diagnosis not present

## 2021-10-17 DIAGNOSIS — F17200 Nicotine dependence, unspecified, uncomplicated: Secondary | ICD-10-CM | POA: Diagnosis not present

## 2021-10-17 DIAGNOSIS — H6123 Impacted cerumen, bilateral: Secondary | ICD-10-CM | POA: Diagnosis not present

## 2021-11-06 DIAGNOSIS — H905 Unspecified sensorineural hearing loss: Secondary | ICD-10-CM | POA: Diagnosis not present

## 2022-01-02 DIAGNOSIS — Z720 Tobacco use: Secondary | ICD-10-CM | POA: Diagnosis not present

## 2022-01-02 DIAGNOSIS — I10 Essential (primary) hypertension: Secondary | ICD-10-CM | POA: Diagnosis not present

## 2022-01-02 DIAGNOSIS — E559 Vitamin D deficiency, unspecified: Secondary | ICD-10-CM | POA: Diagnosis not present

## 2022-01-02 DIAGNOSIS — E538 Deficiency of other specified B group vitamins: Secondary | ICD-10-CM | POA: Diagnosis not present

## 2022-01-02 DIAGNOSIS — L308 Other specified dermatitis: Secondary | ICD-10-CM | POA: Diagnosis not present

## 2022-01-02 DIAGNOSIS — R69 Illness, unspecified: Secondary | ICD-10-CM | POA: Diagnosis not present

## 2022-01-02 DIAGNOSIS — Z8673 Personal history of transient ischemic attack (TIA), and cerebral infarction without residual deficits: Secondary | ICD-10-CM | POA: Diagnosis not present

## 2022-01-02 DIAGNOSIS — E7800 Pure hypercholesterolemia, unspecified: Secondary | ICD-10-CM | POA: Diagnosis not present

## 2022-01-02 DIAGNOSIS — J3089 Other allergic rhinitis: Secondary | ICD-10-CM | POA: Diagnosis not present

## 2022-01-02 DIAGNOSIS — R7303 Prediabetes: Secondary | ICD-10-CM | POA: Diagnosis not present

## 2022-01-02 DIAGNOSIS — Z0000 Encounter for general adult medical examination without abnormal findings: Secondary | ICD-10-CM | POA: Diagnosis not present

## 2022-01-25 DIAGNOSIS — E559 Vitamin D deficiency, unspecified: Secondary | ICD-10-CM | POA: Diagnosis not present

## 2022-01-25 DIAGNOSIS — Z8673 Personal history of transient ischemic attack (TIA), and cerebral infarction without residual deficits: Secondary | ICD-10-CM | POA: Diagnosis not present

## 2022-01-25 DIAGNOSIS — L308 Other specified dermatitis: Secondary | ICD-10-CM | POA: Diagnosis not present

## 2022-01-25 DIAGNOSIS — E538 Deficiency of other specified B group vitamins: Secondary | ICD-10-CM | POA: Diagnosis not present

## 2022-01-25 DIAGNOSIS — R7303 Prediabetes: Secondary | ICD-10-CM | POA: Diagnosis not present

## 2022-01-25 DIAGNOSIS — Z720 Tobacco use: Secondary | ICD-10-CM | POA: Diagnosis not present

## 2022-01-25 DIAGNOSIS — J3089 Other allergic rhinitis: Secondary | ICD-10-CM | POA: Diagnosis not present

## 2022-01-25 DIAGNOSIS — I10 Essential (primary) hypertension: Secondary | ICD-10-CM | POA: Diagnosis not present

## 2022-01-25 DIAGNOSIS — E7800 Pure hypercholesterolemia, unspecified: Secondary | ICD-10-CM | POA: Diagnosis not present

## 2022-01-31 DIAGNOSIS — E785 Hyperlipidemia, unspecified: Secondary | ICD-10-CM | POA: Diagnosis not present

## 2022-02-07 DIAGNOSIS — Z0001 Encounter for general adult medical examination with abnormal findings: Secondary | ICD-10-CM | POA: Diagnosis not present

## 2022-02-07 DIAGNOSIS — Z8673 Personal history of transient ischemic attack (TIA), and cerebral infarction without residual deficits: Secondary | ICD-10-CM | POA: Diagnosis not present

## 2022-02-07 DIAGNOSIS — Z79899 Other long term (current) drug therapy: Secondary | ICD-10-CM | POA: Diagnosis not present

## 2022-02-07 DIAGNOSIS — E559 Vitamin D deficiency, unspecified: Secondary | ICD-10-CM | POA: Diagnosis not present

## 2022-02-07 DIAGNOSIS — E785 Hyperlipidemia, unspecified: Secondary | ICD-10-CM | POA: Diagnosis not present

## 2022-02-07 DIAGNOSIS — H6690 Otitis media, unspecified, unspecified ear: Secondary | ICD-10-CM | POA: Diagnosis not present

## 2022-11-12 ENCOUNTER — Other Ambulatory Visit: Payer: Self-pay | Admitting: Nurse Practitioner

## 2022-11-12 ENCOUNTER — Ambulatory Visit
Admission: RE | Admit: 2022-11-12 | Discharge: 2022-11-12 | Disposition: A | Discharge status: Home / Self Care | Payer: 59 | Source: Ambulatory Visit | Attending: Nurse Practitioner | Admitting: Nurse Practitioner

## 2022-11-12 DIAGNOSIS — M549 Dorsalgia, unspecified: Secondary | ICD-10-CM
